# Patient Record
Sex: Female | Born: 1945
Health system: Southern US, Community
[De-identification: ages and names within clinical notes are randomized; demographics above are authoritative.]

## PROBLEM LIST (undated history)

## (undated) DIAGNOSIS — M069 Rheumatoid arthritis, unspecified: Secondary | ICD-10-CM

## (undated) DIAGNOSIS — I10 Essential (primary) hypertension: Secondary | ICD-10-CM

## (undated) DIAGNOSIS — E785 Hyperlipidemia, unspecified: Secondary | ICD-10-CM

## (undated) DIAGNOSIS — M2550 Pain in unspecified joint: Secondary | ICD-10-CM

## (undated) DIAGNOSIS — J45909 Unspecified asthma, uncomplicated: Secondary | ICD-10-CM

## (undated) DIAGNOSIS — I00 Rheumatic fever without heart involvement: Secondary | ICD-10-CM

## (undated) HISTORY — PX: CATARACT EXTRACTION: SUR2

## (undated) HISTORY — DX: Hyperlipidemia, unspecified: E78.5

## (undated) HISTORY — DX: Essential (primary) hypertension: I10

## (undated) HISTORY — DX: Unspecified asthma, uncomplicated: J45.909

## (undated) HISTORY — DX: Rheumatic fever without heart involvement: I00

## (undated) HISTORY — DX: Pain in unspecified joint: M25.50

---

## 2011-12-10 DIAGNOSIS — Z13820 Encounter for screening for osteoporosis: Secondary | ICD-10-CM | POA: Diagnosis not present

## 2011-12-10 DIAGNOSIS — Z1239 Encounter for other screening for malignant neoplasm of breast: Secondary | ICD-10-CM | POA: Diagnosis not present

## 2011-12-10 DIAGNOSIS — Z23 Encounter for immunization: Secondary | ICD-10-CM | POA: Diagnosis not present

## 2011-12-10 DIAGNOSIS — Z0000 Encounter for general adult medical examination without abnormal findings: Secondary | ICD-10-CM | POA: Diagnosis not present

## 2011-12-10 DIAGNOSIS — Z131 Encounter for screening for diabetes mellitus: Secondary | ICD-10-CM | POA: Diagnosis not present

## 2011-12-17 DIAGNOSIS — Z1231 Encounter for screening mammogram for malignant neoplasm of breast: Secondary | ICD-10-CM | POA: Diagnosis not present

## 2012-02-04 DIAGNOSIS — E785 Hyperlipidemia, unspecified: Secondary | ICD-10-CM | POA: Diagnosis not present

## 2012-02-04 DIAGNOSIS — Z23 Encounter for immunization: Secondary | ICD-10-CM | POA: Diagnosis not present

## 2012-02-04 DIAGNOSIS — M2550 Pain in unspecified joint: Secondary | ICD-10-CM | POA: Diagnosis not present

## 2012-02-04 DIAGNOSIS — J45909 Unspecified asthma, uncomplicated: Secondary | ICD-10-CM | POA: Diagnosis not present

## 2012-02-04 DIAGNOSIS — Z79899 Other long term (current) drug therapy: Secondary | ICD-10-CM | POA: Diagnosis not present

## 2012-02-04 DIAGNOSIS — I10 Essential (primary) hypertension: Secondary | ICD-10-CM | POA: Diagnosis not present

## 2012-02-09 DIAGNOSIS — Z79899 Other long term (current) drug therapy: Secondary | ICD-10-CM | POA: Diagnosis not present

## 2012-02-09 DIAGNOSIS — E785 Hyperlipidemia, unspecified: Secondary | ICD-10-CM | POA: Diagnosis not present

## 2012-02-09 DIAGNOSIS — M2550 Pain in unspecified joint: Secondary | ICD-10-CM | POA: Diagnosis not present

## 2012-02-09 DIAGNOSIS — I10 Essential (primary) hypertension: Secondary | ICD-10-CM | POA: Diagnosis not present

## 2012-02-14 ENCOUNTER — Encounter: Payer: Self-pay | Admitting: Pulmonary Disease

## 2012-03-07 DIAGNOSIS — R82998 Other abnormal findings in urine: Secondary | ICD-10-CM | POA: Diagnosis not present

## 2012-03-13 ENCOUNTER — Encounter: Payer: Self-pay | Admitting: Pulmonary Disease

## 2012-03-13 ENCOUNTER — Ambulatory Visit (INDEPENDENT_AMBULATORY_CARE_PROVIDER_SITE_OTHER): Payer: Medicare Other | Admitting: Pulmonary Disease

## 2012-03-13 ENCOUNTER — Ambulatory Visit (INDEPENDENT_AMBULATORY_CARE_PROVIDER_SITE_OTHER)
Admission: RE | Admit: 2012-03-13 | Discharge: 2012-03-13 | Disposition: A | Discharge status: Home / Self Care | Payer: Medicare Other | Source: Ambulatory Visit | Attending: Pulmonary Disease | Admitting: Pulmonary Disease

## 2012-03-13 VITALS — BP 126/60 | HR 66 | Temp 97.7°F | Ht 66.0 in | Wt 163.6 lb

## 2012-03-13 DIAGNOSIS — J8410 Pulmonary fibrosis, unspecified: Secondary | ICD-10-CM | POA: Diagnosis not present

## 2012-03-13 DIAGNOSIS — J449 Chronic obstructive pulmonary disease, unspecified: Secondary | ICD-10-CM | POA: Insufficient documentation

## 2012-03-13 DIAGNOSIS — J45909 Unspecified asthma, uncomplicated: Secondary | ICD-10-CM | POA: Diagnosis not present

## 2012-03-13 IMAGING — CR DG CHEST 2V
2 series · 2 of 2 positions shown · non-contrast
Comparison: None.

CLINICAL DATA: 66-year-old female with asthma.  Hypertension.

CHEST - 2 VIEW

[view not recorded (1 of 2)]
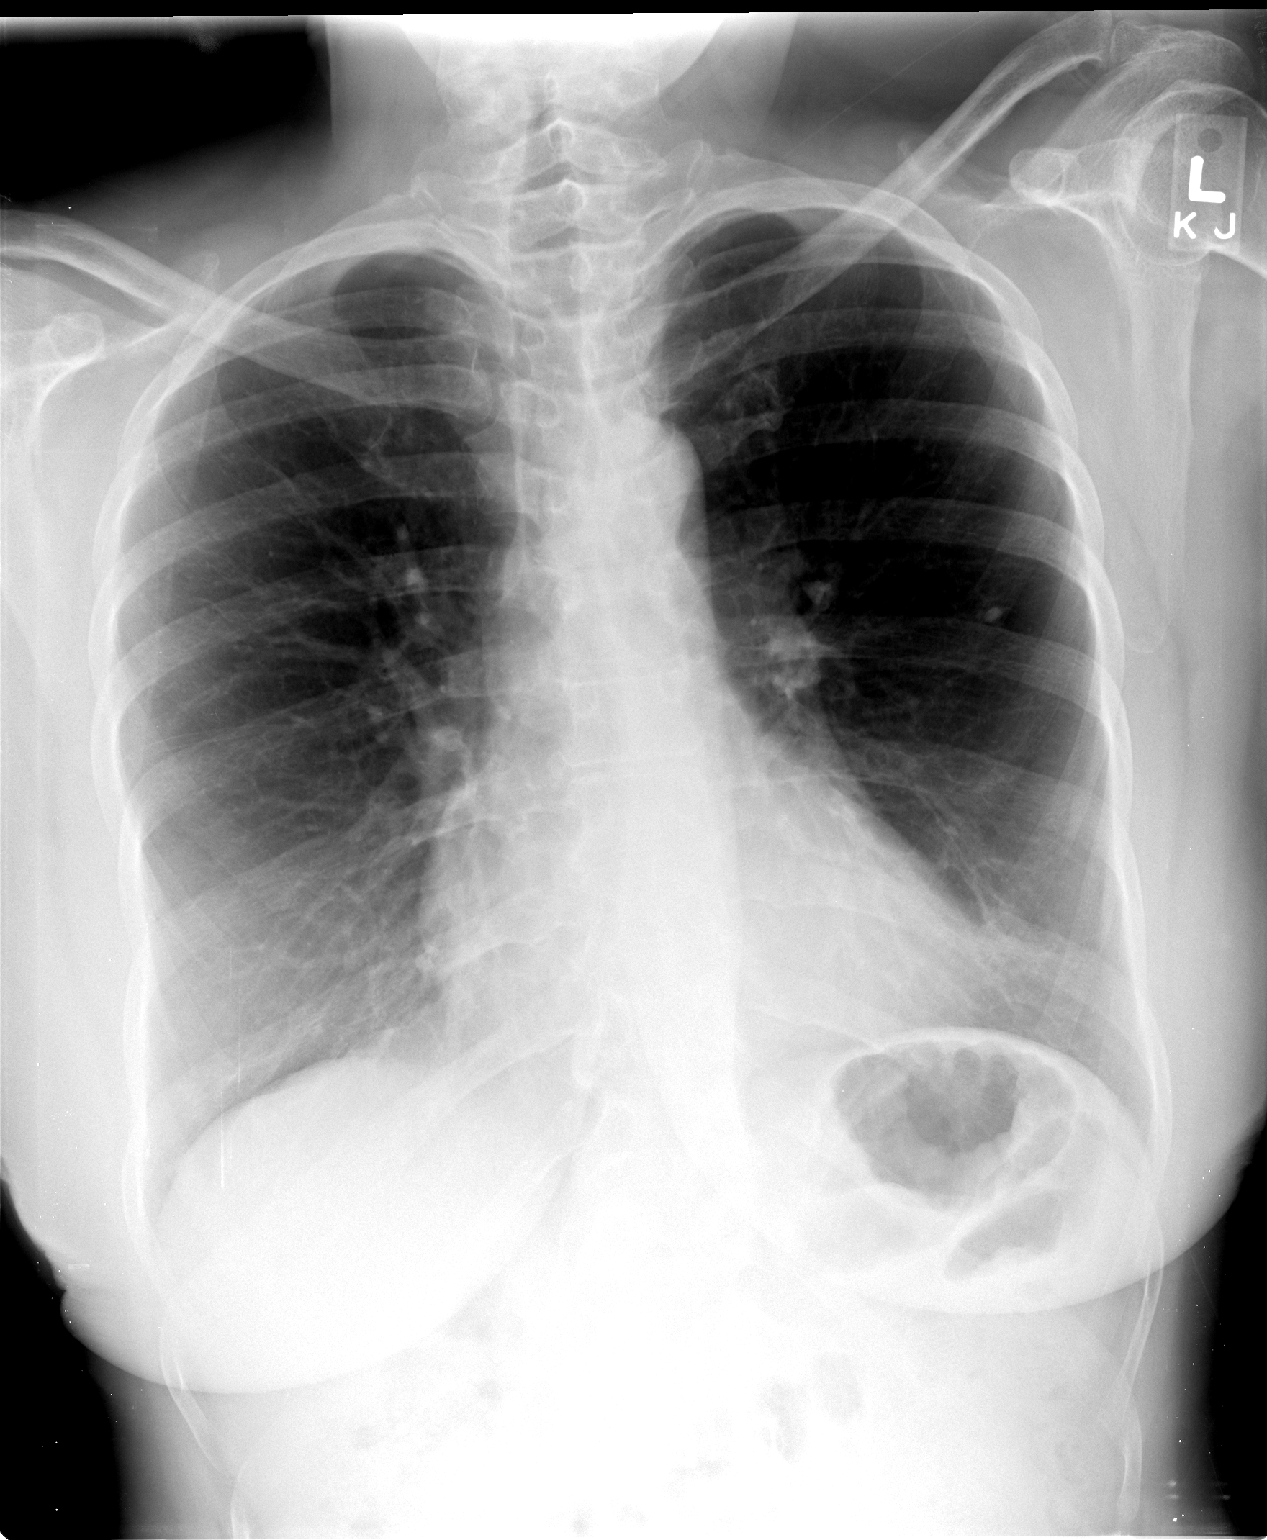

[view not recorded (2 of 2)]
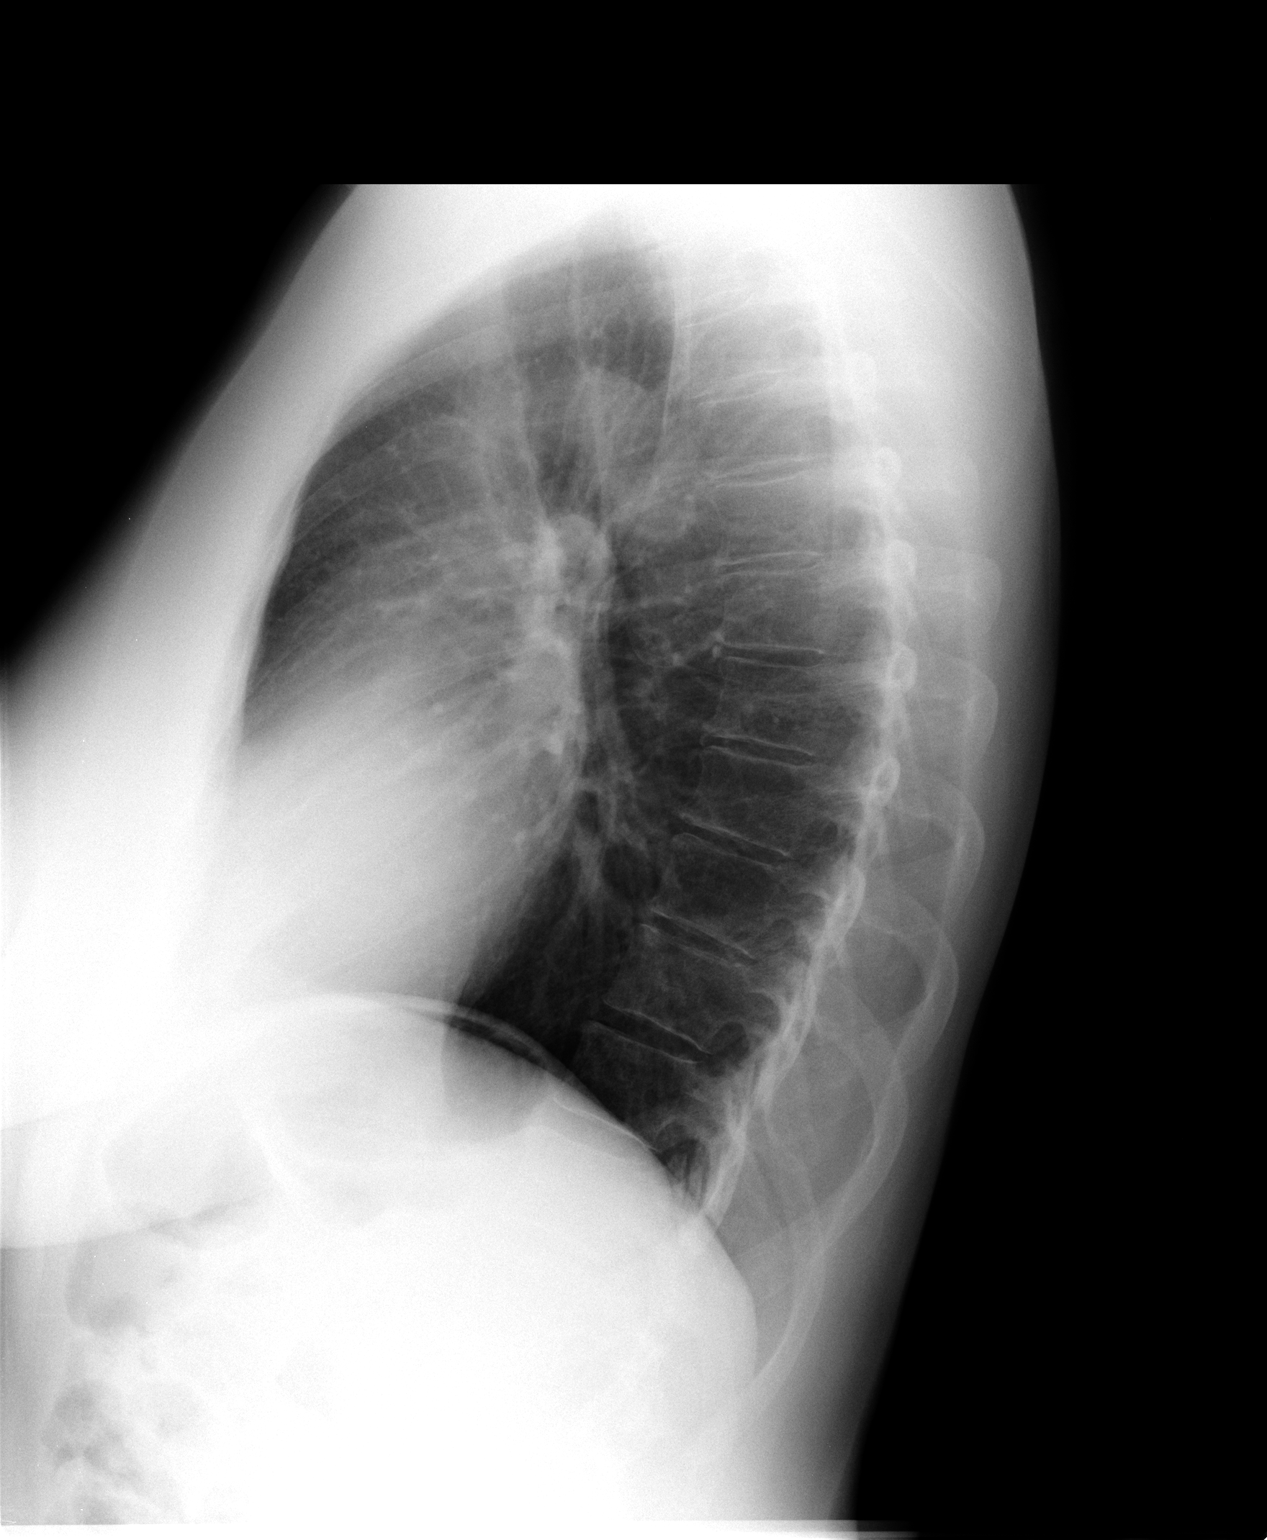

[2 of 2 positions shown; findings below may reference images not displayed]

FINDINGS: Lung volumes are within normal limits.  Cardiac size and
mediastinal contours are within normal limits.  Visualized tracheal
air column is within normal limits.  No pneumothorax or pulmonary
edema.  No pleural effusion or confluent airspace opacity.  Mild
increased linear markings at the lung bases.  Oval probable
calcified granuloma in the left mid lung. There may be calcified
nodes at the left hilum as well.  No acute osseous abnormality
identified.
IMPRESSION: Basilar predominant increased linear markings probably reflects
scarring.  Left lung granuloma.  No acute cardiopulmonary
abnormality.

## 2012-03-13 MED ORDER — VALSARTAN 160 MG PO TABS: 80.0000 mg | ORAL_TABLET | Freq: Every day | ORAL | Status: DC

## 2012-03-13 MED ORDER — MOMETASONE FURO-FORMOTEROL FUM 100-5 MCG/ACT IN AERO: 2.0000 | INHALATION_SPRAY | Freq: Two times a day (BID) | RESPIRATORY_TRACT | Status: DC

## 2012-03-13 NOTE — Patient Instructions (Addendum)
Stop prednisone.  Do not take, and call me if you feel your breathing is significantly worse. Stop lisinopril.  Will give you a prescription for diovan 160mg , one half tablet each day. Stop advair.  Will start dulera 100/5  2 inhalations each am AND pm everyday.  Rinse mouth well.  Try and use your albuterol as little as possible. Ok to stay on singulair for now. Will check a cxr today, and call you with results. Will schedule for breathing tests in 3 weeks, and would like to see you the same day to review.

## 2012-03-13 NOTE — Progress Notes (Signed)
  Subjective:    Patient ID: Heather Austin, female    DOB: [birth year], 66 y.o.   MRN: [id number]  HPI The patient is a 66 year old female who I have been asked to see for management of asthma.  The patient states she was diagnosed with asthma as a child, had significant improvement in her symptoms during her 30s, and started having difficulties with her symptoms again in the 44s.  She has been on Advair for about 10 years, and doesn't feel that it helps her.  She has been allergy tested in the past, but did not think that she had significant allergic disease.  However, she was apparently in a Xolair study from 2010-2012.  She was taken out of the study when she left [place] and moved to [place].  The patient thinks that Xolair helped her.  The patient states that she takes prednisone chronically on a p.r.n. Basis at 10 mg a day, and also uses her rescue inhaler and albuterol nebulizer machine daily.  She states that she currently has audible wheezing, and describes a dyspnea on exertion at less than 2 blocks.  She denies any chronic cough or mucus production, any sinus issues or infections, nor does she have any acid reflux symptoms currently.  She does not think that she has allergy issues in the spring or fall.  She has not had PFTs in greater than 10 years, and has had no recent chest x-ray.  It should also be noted that she has been on an ACE inhibitor for the last 4-5 years.   Review of Systems  Constitutional: Negative for fever and unexpected weight change.  HENT: Negative for ear pain, nosebleeds, congestion, sore throat, rhinorrhea, sneezing, trouble swallowing, dental problem, postnasal drip and sinus pressure.   Eyes: Negative for redness and itching.  Respiratory: Positive for shortness of breath and wheezing. Negative for cough and chest tightness.   Cardiovascular: Negative for palpitations and leg swelling.  Gastrointestinal: Negative for nausea and vomiting.  Genitourinary:  Negative for dysuria.  Musculoskeletal: Negative for joint swelling.  Skin: Negative for rash.  Neurological: Negative for headaches.  Hematological: Does not bruise/bleed easily.  Psychiatric/Behavioral: Negative for dysphoric mood. The patient is not nervous/anxious.        Objective:   Physical Exam Constitutional:  Overweight female, no acute distress  HENT:  Nares patent without discharge, septal deviation to left.  Mild mucosal edema  Oropharynx without exudate, palate and uvula are normal  Eyes:  Perrla, eomi, no scleral icterus  Neck:  No JVD, no TMG  Cardiovascular:  Normal rate, regular rhythm, no rubs or gallops.  No murmurs        Intact distal pulses  Pulmonary :  Normal breath sounds, no stridor or respiratory distress   No rales, rhonchi, or wheezing  Abdominal:  Soft, nondistended, bowel sounds present.  No tenderness noted.   Musculoskeletal:  No lower extremity edema noted.  Lymph Nodes:  No cervical lymphadenopathy noted  Skin:  No cyanosis noted  Neurologic:  Alert, appropriate, moves all 4 extremities without obvious deficit.         Assessment & Plan:

## 2012-03-13 NOTE — Assessment & Plan Note (Signed)
The patient has a history of asthma, and is having symptoms of audible wheezing that sounds more upper airway in origin as well as dyspnea on exertion.  I would find it very unlikely that she is having asthma to the extent that she requires frequent doses of prednisone and daily rescue inhaler use, despite being on Advair and Singulair compliantly.  This would usually tell me that her symptoms are either not being caused by asthma, for that we are missing exacerbating factors.  I am concerned about her ACE inhibitor causing upper airway symptoms that often can mimic asthma, and we also need to consider whether her allergic disease or reflux disease or compounding factors.  I would like to change her Advair to dulera because of its frequent irritation to the upper airway, and we'll also change her ACE inhibitor to Diovan for the next 4 weeks to see if this will help as well.  She is going to need a chest x-ray, pulmonary function tests, and may ultimately need allergy testing.  She does not think that she has significant allergies, however I would find it very unlikely that she could get into a Xolair trial without having significant allergic disease.

## 2012-04-04 ENCOUNTER — Ambulatory Visit (INDEPENDENT_AMBULATORY_CARE_PROVIDER_SITE_OTHER): Payer: Medicare Other | Admitting: Pulmonary Disease

## 2012-04-04 ENCOUNTER — Encounter: Payer: Self-pay | Admitting: Pulmonary Disease

## 2012-04-04 ENCOUNTER — Other Ambulatory Visit: Payer: Medicare Other

## 2012-04-04 VITALS — BP 142/88 | HR 72 | Temp 97.9°F | Ht 67.0 in | Wt 164.0 lb

## 2012-04-04 DIAGNOSIS — J45909 Unspecified asthma, uncomplicated: Secondary | ICD-10-CM

## 2012-04-04 LAB — PULMONARY FUNCTION TEST

## 2012-04-04 MED ORDER — MOMETASONE FURO-FORMOTEROL FUM 100-5 MCG/ACT IN AERO: 2.0000 | INHALATION_SPRAY | Freq: Two times a day (BID) | RESPIRATORY_TRACT | Status: DC

## 2012-04-04 MED ORDER — ALBUTEROL SULFATE HFA 108 (90 BASE) MCG/ACT IN AERS: 2.0000 | INHALATION_SPRAY | Freq: Four times a day (QID) | RESPIRATORY_TRACT | Status: DC | PRN

## 2012-04-04 NOTE — Progress Notes (Signed)
  Subjective:    Patient ID: Heather Austin, female    DOB: 08-27-1945, 66 y.o.   MRN: [id number]  HPI Patient comes in today for followup of her known asthma.  At the last visit, we discontinued her lisinopril and substitute Diovan.  We also changed her Advair to dulera, because of concern over upper airway irritation and pseudo-wheezing.  The patient comes in today where she feels these changes have made significant improvement in her breathing.  She is no longer wheezing or requiring her rescue inhaler.  She did do pulmonary function studies today, and this showed fixed moderate to severe airflow obstruction.  I have reviewed the study with her in detail, and answered all of her questions.   Review of Systems  Constitutional: Negative for fever and unexpected weight change.  HENT: Negative for ear pain, nosebleeds, congestion, sore throat, rhinorrhea, sneezing, trouble swallowing, dental problem, postnasal drip and sinus pressure.   Eyes: Negative for redness and itching.  Respiratory: Positive for shortness of breath ( with exertion). Negative for cough, chest tightness and wheezing.   Cardiovascular: Negative for palpitations and leg swelling.  Gastrointestinal: Negative for nausea and vomiting.  Genitourinary: Negative for dysuria.  Musculoskeletal: Negative for joint swelling.       Joint stiffness  Skin: Negative for rash.  Neurological: Negative for headaches.  Hematological: Does not bruise/bleed easily.  Psychiatric/Behavioral: Negative for dysphoric mood. The patient is not nervous/anxious.        Objective:   Physical Exam Well-developed female in no acute distress Nose without purulence or discharge noted Neck without lymphadenopathy or thyromegaly Chest with totally clear breath sounds, no wheezing Cardiac exam is regular rate and rhythm Lower extremities without edema, cyanosis Alert and oriented, moves all 4 extremities       Assessment & Plan:

## 2012-04-04 NOTE — Patient Instructions (Addendum)
Stay on dulera, and we will give you a prescription for this. Stay off lisinopril, but check with Dr. Jillyn Hidden and make sure diovan is an adequate alternative. Will check allergy panel today, and will call you with results.   Stay on singulair until we get your allergy panel back.  If doing well, followup with me again in 4mos.  Please call if you develop breathing issues.

## 2012-04-04 NOTE — Progress Notes (Signed)
PFT done today. 

## 2012-04-04 NOTE — Addendum Note (Signed)
Addended by: Nita Sells on: 04/04/2012 02:50 PM   Modules accepted: Orders

## 2012-04-04 NOTE — Assessment & Plan Note (Signed)
The patient has moderate to severe airflow obstruction by her PFTs, and my suspicion is this represents a fixed asthma/COPD.  She has had long-standing asthma for most of her life, and not only could not afford treatment in the past, but there really wasn't good anti-inflammatory medication available.  This has lead to airway remodeling and fixed airflow obstruction.  She clearly is doing much better with the elimination of the ACE inhibitor and changing from dry powder to [facility].  I have asked her to check with her primary physician to make sure Diovan isn't adequate substitution for her lisinopril.  She also has a history of being on Xolair, and therefore likely has an allergy component to her disease.

## 2012-04-05 LAB — ALLERGY FULL PROFILE
Allergen, D pternoyssinus,d7: 0.13 kU/L — ABNORMAL HIGH
Allergen,Goose feathers, e70: <0.1 kU/L
Alternaria Alternata: 3.89 kU/L — ABNORMAL HIGH
Aspergillus fumigatus, IgG: 0.9 kU/L — ABNORMAL HIGH
Bahia Grass: 4.11 kU/L — ABNORMAL HIGH
Bermuda Grass: 2.79 kU/L — ABNORMAL HIGH
Box Elder IgE: 0.25 kU/L — ABNORMAL HIGH
Candida Albicans: 0.71 kU/L — ABNORMAL HIGH
Cat Dander: <0.1 kU/L
Common Ragweed: 0.48 kU/L — ABNORMAL HIGH
Curvularia lunata: 1.06 kU/L — ABNORMAL HIGH
D. farinae: 0.17 kU/L — ABNORMAL HIGH
Dog Dander: 1.64 kU/L — ABNORMAL HIGH
Elm IgE: 0.48 kU/L — ABNORMAL HIGH
Fescue: 5.56 kU/L — ABNORMAL HIGH
G005 Rye, Perennial: 4.89 kU/L — ABNORMAL HIGH
G009 Red Top: 5.57 kU/L — ABNORMAL HIGH
Goldenrod: 0.86 kU/L — ABNORMAL HIGH
Helminthosporium halodes: 1.94 kU/L — ABNORMAL HIGH
House Dust Hollister: 0.59 kU/L — ABNORMAL HIGH
IgE (Immunoglobulin E), Serum: 439.4 IU/mL — ABNORMAL HIGH (ref 0.0–180.0)
Lamb's Quarters: 0.26 kU/L — ABNORMAL HIGH
Oak: 0.21 kU/L — ABNORMAL HIGH
Plantain: 0.69 kU/L — ABNORMAL HIGH
Stemphylium Botryosum: 1.74 kU/L — ABNORMAL HIGH
Sycamore Tree: 1.47 kU/L — ABNORMAL HIGH
Timothy Grass: 3.81 kU/L — ABNORMAL HIGH

## 2012-05-15 DIAGNOSIS — M2550 Pain in unspecified joint: Secondary | ICD-10-CM | POA: Diagnosis not present

## 2012-05-15 DIAGNOSIS — E785 Hyperlipidemia, unspecified: Secondary | ICD-10-CM | POA: Diagnosis not present

## 2012-05-15 DIAGNOSIS — R7301 Impaired fasting glucose: Secondary | ICD-10-CM | POA: Diagnosis not present

## 2012-05-15 DIAGNOSIS — E559 Vitamin D deficiency, unspecified: Secondary | ICD-10-CM | POA: Diagnosis not present

## 2012-05-15 DIAGNOSIS — J45909 Unspecified asthma, uncomplicated: Secondary | ICD-10-CM | POA: Diagnosis not present

## 2012-05-15 DIAGNOSIS — Z79899 Other long term (current) drug therapy: Secondary | ICD-10-CM | POA: Diagnosis not present

## 2012-05-15 DIAGNOSIS — I10 Essential (primary) hypertension: Secondary | ICD-10-CM | POA: Diagnosis not present

## 2012-05-16 ENCOUNTER — Encounter: Payer: Self-pay | Admitting: Internal Medicine

## 2012-05-16 ENCOUNTER — Ambulatory Visit (INDEPENDENT_AMBULATORY_CARE_PROVIDER_SITE_OTHER): Payer: Medicare Other | Admitting: Internal Medicine

## 2012-05-16 VITALS — BP 136/80 | HR 68 | Ht 66.0 in | Wt 166.2 lb

## 2012-05-16 DIAGNOSIS — J45909 Unspecified asthma, uncomplicated: Secondary | ICD-10-CM | POA: Diagnosis not present

## 2012-05-16 DIAGNOSIS — J45998 Other asthma: Secondary | ICD-10-CM

## 2012-05-16 NOTE — Patient Instructions (Addendum)
I would recommend that you follow for asthma management with Dr Shelle Iron for now. As long as the asthma is easily controlled with your current meds, that is good enough. If your asthma gets harder to manage, or you begin having a lot of sneezing, itching and sinus trouble, or other allergy discomforts, I will be happy to see you again.

## 2012-05-16 NOTE — Progress Notes (Signed)
05/16/12- 8 yoF never smoker referred for allergy evaluation courtesy of Dr Shelle Iron who follows her for pulmonary management of asthma. Onset of asthma round age 67 or 2. Never had allergic rhinitis. Treated with Xolair for asthma 2 years ago in [place] but did not like shots. We skin test positive then but never on allergy vaccine. No history of allergic problems with foods, skin rashes or sinus disease. She lives in an older house with cross base, dog, hardwood floors, central air, gas heat, no mold and no smokers. She is retired with her work exposure now. Family history a sister has asthma Allergy Profile 04/04/2012-total IgE 439.4 with significant elevations for almost everything on the panel except epithelial allergens/cat, dog and feathers  Prior to Admission medications   Medication Sig Start Date End Date Taking? Authorizing Provider  albuterol (PROVENTIL HFA;VENTOLIN HFA) 108 (90 BASE) MCG/ACT inhaler Inhale 2 puffs into the lungs every 6 (six) hours as needed. 04/04/12  Yes Barbaraann Share, MD  montelukast (SINGULAIR) 10 MG tablet Take 10 mg by mouth at bedtime.   Yes Historical Provider, MD  NIFEdipine (ADALAT CC) 90 MG 24 hr tablet Take 90 mg by mouth daily.   Yes Historical Provider, MD  pravastatin (PRAVACHOL) 20 MG tablet Take 20 mg by mouth daily.   Yes Historical Provider, MD  traMADol (ULTRAM) 50 MG tablet Take 1 tablet by mouth Every 6 hours as needed. 05/15/12  Yes Historical Provider, MD  valsartan (DIOVAN) 160 MG tablet Take 0.5 tablets (80 mg total) by mouth daily. 03/13/12  Yes Barbaraann Share, MD  mometasone-formoterol (DULERA) 100-5 MCG/ACT AERO Inhale 2 puffs into the lungs 2 (two) times daily. 04/04/12   Barbaraann Share, MD   Past Medical History  Diagnosis Date  . Asthma   . Multiple joint pain   . HTN (hypertension), benign   . Hyperlipidemia    History reviewed. No pertinent past surgical history. Family History  Problem Relation Age of Onset  . Asthma  Sister   . Allergies Son   . Allergies Daughter    History   Social History  . Marital Status: Married    Spouse Name: N/A    Number of Children: N/A  . Years of Education: N/A   Occupational History  . Not on file.   Social History Main Topics  . Smoking status: Never Smoker   . Smokeless tobacco: Never Used  . Alcohol Use: No  . Drug Use: No  . Sexually Active: Not on file   Other Topics Concern  . Not on file   Social History Narrative  . No narrative on file   ROS-see HPI Constitutional:   No-   weight loss, night sweats, fevers, chills, fatigue, lassitude. HEENT:   No-  headaches, difficulty swallowing, tooth/dental problems, sore throat,       No-  sneezing, itching, ear ache, nasal congestion, post nasal drip,  CV:  No-   chest pain, orthopnea, PND, swelling in lower extremities, anasarca,                                  dizziness, palpitations Resp: +  shortness of breath with exertion or at rest.              No-   productive cough,  + non-productive cough,  No- coughing up of blood.  No-   change in color of mucus.  + wheezing.   Skin: No-   rash or lesions. GI:  No-   heartburn, indigestion, abdominal pain, nausea, vomiting, diarrhea,                 change in bowel habits, loss of appetite GU: No-   dysuria, change in color of urine, no urgency or frequency.  No- flank pain. MS:  No-   joint pain or swelling.  No- decreased range of motion.  No- back pain. Neuro-     nothing unusual Psych:  No- change in mood or affect. No depression or anxiety.  No memory loss.  OBJ- Physical Exam General- Alert, Oriented, Affect-appropriate, Distress- none acute Skin- rash-none, lesions- none, excoriation- none Lymphadenopathy- none Head- atraumatic            Eyes- Gross vision intact, PERRLA, conjunctivae and secretions clear            Ears- Hearing, canals-normal            Nose- Clear, no-Septal dev, mucus, polyps, erosion, perforation              Throat- Mallampati II , mucosa clear , drainage- none, tonsils- atrophic Neck- flexible , trachea midline, no stridor , thyroid nl, carotid no bruit Chest - symmetrical excursion , unlabored           Heart/CV- RRR , no murmur , no gallop  , no rub, nl s1 s2                           - JVD- none , edema- none, stasis changes- none, varices- none           Lung- clear to P&A, wheeze- none, cough- none , dullness-none, rub- none           Chest wall-  Abd- tender-no, distended-no, bowel sounds-present, HSM- no Br/ Gen/ Rectal- Not done, not indicated Extrem- cyanosis- none, clubbing, none, atrophy- none, strength- nl Neuro- grossly intact to observation

## 2012-05-27 ENCOUNTER — Encounter: Payer: Self-pay | Admitting: Internal Medicine

## 2012-05-27 NOTE — Assessment & Plan Note (Addendum)
She says that she is comfortable now and satisfied with her control, not seeing a need to do anything more. We discussed her allergy profile and its implication that environmental allergy may be important at times. We discussed environmental precautions including dust mite control We can consider allergy skin testing and other lines of intervention in the future if needed.

## 2012-05-31 DIAGNOSIS — M25519 Pain in unspecified shoulder: Secondary | ICD-10-CM | POA: Diagnosis not present

## 2012-05-31 DIAGNOSIS — M25549 Pain in joints of unspecified hand: Secondary | ICD-10-CM | POA: Diagnosis not present

## 2012-07-21 DIAGNOSIS — M2550 Pain in unspecified joint: Secondary | ICD-10-CM | POA: Diagnosis not present

## 2012-07-21 DIAGNOSIS — J45909 Unspecified asthma, uncomplicated: Secondary | ICD-10-CM | POA: Diagnosis not present

## 2012-08-03 ENCOUNTER — Ambulatory Visit: Payer: Medicare Other | Admitting: Pulmonary Disease

## 2012-08-09 DIAGNOSIS — M2550 Pain in unspecified joint: Secondary | ICD-10-CM | POA: Diagnosis not present

## 2012-08-09 DIAGNOSIS — M79609 Pain in unspecified limb: Secondary | ICD-10-CM | POA: Diagnosis not present

## 2012-08-16 DIAGNOSIS — M069 Rheumatoid arthritis, unspecified: Secondary | ICD-10-CM | POA: Diagnosis not present

## 2012-08-30 ENCOUNTER — Ambulatory Visit (INDEPENDENT_AMBULATORY_CARE_PROVIDER_SITE_OTHER): Payer: Medicare Other | Admitting: Pulmonary Disease

## 2012-08-30 ENCOUNTER — Encounter: Payer: Self-pay | Admitting: Pulmonary Disease

## 2012-08-30 VITALS — BP 190/80 | HR 81 | Temp 97.3°F | Ht 67.0 in | Wt 164.4 lb

## 2012-08-30 DIAGNOSIS — J45998 Other asthma: Secondary | ICD-10-CM

## 2012-08-30 DIAGNOSIS — J45909 Unspecified asthma, uncomplicated: Secondary | ICD-10-CM

## 2012-08-30 MED ORDER — MOMETASONE FURO-FORMOTEROL FUM 100-5 MCG/ACT IN AERO: 2.0000 | INHALATION_SPRAY | Freq: Two times a day (BID) | RESPIRATORY_TRACT | Status: DC

## 2012-08-30 MED ORDER — MONTELUKAST SODIUM 10 MG PO TABS: 10.0000 mg | ORAL_TABLET | Freq: Every day | ORAL | Status: DC

## 2012-08-30 MED ORDER — ALBUTEROL SULFATE HFA 108 (90 BASE) MCG/ACT IN AERS: 2.0000 | INHALATION_SPRAY | Freq: Four times a day (QID) | RESPIRATORY_TRACT | Status: DC | PRN

## 2012-08-30 NOTE — Addendum Note (Signed)
Addended by: Nita Sells on: 08/30/2012 11:56 AM   Modules accepted: Orders

## 2012-08-30 NOTE — Assessment & Plan Note (Signed)
The patient has significant asthma with allergy component, but overall is doing fairly well on her current regimen.  I would like her to take an over-the-counter antihistamine at least thru spring allergy season, and most likely will need in the fall as well.  I stressed to her the importance of inhaled corticosteroids again, and asked her to stay on the dulera religiously.  She will followup with me in 6 months if doing well, but is to call if she is having increased symptoms.

## 2012-08-30 NOTE — Patient Instructions (Addendum)
Will refill your medications Stay on dulera twice a day everyday. Continue singulair for your allergies, but I want you to take zyrtec 10mg  otc everynight at bedtime until the end of June.  May have to start it back during fall allergy season in Sept/Oct.   followup with me in 6mos

## 2012-08-30 NOTE — Progress Notes (Signed)
  Subjective:    Patient ID: Heather Austin, female    DOB: 08-13-1945, 68 y.o.   MRN: [id number]  HPI Patient comes in today for followup of her asthma with significant allergic component.  She has done well overall since the last visit, but with the current allergy season has had increased symptoms when going outside that requires her to use her rescue inhaler.  She is staying on Singulair, but has not been taking an antihistamine.  Overall she is satisfied with her asthma control, and has not had any significant flareup.   Review of Systems  Constitutional: Negative for fever and unexpected weight change.  HENT: Negative for ear pain, nosebleeds, congestion, sore throat, rhinorrhea, sneezing, trouble swallowing, dental problem, postnasal drip and sinus pressure.   Eyes: Negative for redness and itching.  Respiratory: Positive for wheezing. Negative for cough, chest tightness and shortness of breath.   Cardiovascular: Negative for palpitations and leg swelling.  Gastrointestinal: Negative for nausea and vomiting.  Genitourinary: Negative for dysuria.  Musculoskeletal: Negative for joint swelling.  Skin: Negative for rash.  Neurological: Negative for headaches.  Hematological: Does not bruise/bleed easily.  Psychiatric/Behavioral: Negative for dysphoric mood. The patient is not nervous/anxious.        Objective:   Physical Exam Well-developed female in no acute distress Nose without purulence or discharge noted Neck without lymphadenopathy or thyromegaly Chest with clear breath sounds bilaterally, no wheezing Cardiac exam with regular rate and rhythm Lower extremities without edema, cyanosis Alert and oriented, moves all 4 extremities.       Assessment & Plan:

## 2012-09-27 DIAGNOSIS — M069 Rheumatoid arthritis, unspecified: Secondary | ICD-10-CM | POA: Diagnosis not present

## 2012-12-14 DIAGNOSIS — M069 Rheumatoid arthritis, unspecified: Secondary | ICD-10-CM | POA: Diagnosis not present

## 2013-02-13 DIAGNOSIS — M069 Rheumatoid arthritis, unspecified: Secondary | ICD-10-CM | POA: Diagnosis not present

## 2013-03-02 ENCOUNTER — Encounter: Payer: Self-pay | Admitting: Pulmonary Disease

## 2013-03-02 ENCOUNTER — Ambulatory Visit (INDEPENDENT_AMBULATORY_CARE_PROVIDER_SITE_OTHER): Payer: Medicare Other | Admitting: Pulmonary Disease

## 2013-03-02 VITALS — BP 128/88 | HR 84 | Temp 97.3°F | Ht 66.5 in | Wt 161.6 lb

## 2013-03-02 DIAGNOSIS — Z23 Encounter for immunization: Secondary | ICD-10-CM

## 2013-03-02 DIAGNOSIS — J45909 Unspecified asthma, uncomplicated: Secondary | ICD-10-CM

## 2013-03-02 DIAGNOSIS — R0609 Other forms of dyspnea: Secondary | ICD-10-CM | POA: Insufficient documentation

## 2013-03-02 DIAGNOSIS — R0989 Other specified symptoms and signs involving the circulatory and respiratory systems: Secondary | ICD-10-CM | POA: Diagnosis not present

## 2013-03-02 DIAGNOSIS — R0600 Dyspnea, unspecified: Secondary | ICD-10-CM

## 2013-03-02 MED ORDER — MOMETASONE FURO-FORMOTEROL FUM 200-5 MCG/ACT IN AERO: 2.0000 | INHALATION_SPRAY | Freq: Two times a day (BID) | RESPIRATORY_TRACT | Status: DC

## 2013-03-02 MED ORDER — ALBUTEROL SULFATE HFA 108 (90 BASE) MCG/ACT IN AERS: 2.0000 | INHALATION_SPRAY | Freq: Four times a day (QID) | RESPIRATORY_TRACT | Status: DC | PRN

## 2013-03-02 NOTE — Patient Instructions (Signed)
Will increase dulera to the 200/5 strength.  Take 2 puffs am and pm everyday, and rinse mouth. Continue with singulair for now. Will send in prescription for your albuterol. Will schedule for breathing studies, and also a scan of your chest to evaluate for scarring from rheumatoid disease.  Will call you with results. Continue to stay active. Will arrange followup once I see test results.

## 2013-03-02 NOTE — Assessment & Plan Note (Signed)
The patient is having worsening dyspnea on exertion despite being on adequate asthma medication. This may simply be poor asthma control because of her allergic disease, however it has come to my attention that she may have rheumatoid arthritis and does have to consider the possibility of rheumatoid lung. She did have "scarring" on her last chest x-ray, but it was not overly impressive. I will get notes from her rheumatologist, and also schedule her for a CT chest and PFTs.

## 2013-03-02 NOTE — Progress Notes (Signed)
  Subjective:    Patient ID: Heather Austin, female    DOB: 1945-04-30, 67 y.o.   MRN: [id number]  HPI The patient comes in today for followup of her known asthma with significant allergic component.  She has been evaluated by allergy, but was doing so well, and decided against further testing or allergy vaccine.  She now has seen worsening shortness of breath over the last 6 months, and does tell me that she had a change in her medication for arthritis.  I have asked her specifically about the type of arthritis, and she thinks that it is rheumatoid.  She has not had a significant cough or mucus, but it has clearly had decrease in exertional tolerance.  Her chest x-ray last year showed some mild scarring at the bases.   Review of Systems  Constitutional: Negative for fever and unexpected weight change.  HENT: Negative for congestion, dental problem, ear pain, nosebleeds, postnasal drip, rhinorrhea, sinus pressure, sneezing, sore throat and trouble swallowing.   Eyes: Negative for redness and itching.  Respiratory: Positive for shortness of breath and wheezing. Negative for cough and chest tightness.   Cardiovascular: Negative for palpitations and leg swelling.  Gastrointestinal: Negative for nausea and vomiting.  Genitourinary: Negative for dysuria.  Musculoskeletal: Negative for joint swelling.  Skin: Negative for rash.  Neurological: Negative for headaches.  Hematological: Does not bruise/bleed easily.  Psychiatric/Behavioral: Negative for dysphoric mood. The patient is not nervous/anxious.        Objective:   Physical Exam Well-developed female in no acute distress Nose without purulence or d/c noted. Neck without LN or TMG Chest with fairly clear bs, no wheezing or rhonchi Cor with rrr LE with no significant edema, no cyanosis Alert and oriented, moves all 4.          Assessment & Plan:

## 2013-03-02 NOTE — Assessment & Plan Note (Signed)
The patient feels that her breathing has worsened over the last 6 months, and she has been using increased rescue inhaler. She has a significant allergic component to her asthma, and Singulair has been started as well. She had decided to hold off on allergy vaccine or further evaluation, because she has been stable in the past. It is unclear whether her worsening shortness of breath is due to her asthma, or is there another process ongoing.

## 2013-03-05 ENCOUNTER — Ambulatory Visit (INDEPENDENT_AMBULATORY_CARE_PROVIDER_SITE_OTHER)
Admission: RE | Admit: 2013-03-05 | Discharge: 2013-03-05 | Disposition: A | Discharge status: Home / Self Care | Payer: Medicare Other | Source: Ambulatory Visit | Attending: Pulmonary Disease | Admitting: Pulmonary Disease

## 2013-03-05 DIAGNOSIS — R0600 Dyspnea, unspecified: Secondary | ICD-10-CM

## 2013-03-05 DIAGNOSIS — J984 Other disorders of lung: Secondary | ICD-10-CM | POA: Diagnosis not present

## 2013-03-05 DIAGNOSIS — R0989 Other specified symptoms and signs involving the circulatory and respiratory systems: Secondary | ICD-10-CM | POA: Diagnosis not present

## 2013-03-05 DIAGNOSIS — R0609 Other forms of dyspnea: Secondary | ICD-10-CM

## 2013-03-05 IMAGING — CT CT CHEST W/O CM
1 of 5 series · 4 of 36 positions shown, 5 images · non-contrast
Comparison: None.

CLINICAL DATA: Increasing shortness of breath with exertion since
starting the medication. History of asthma.

EXAM:
CT CHEST WITHOUT CONTRAST
TECHNIQUE: Multidetector CT imaging of the chest was performed following the
standard protocol without IV contrast.

[Series 602: cor · coronal · 0.65mm/px · 4 of 95 slices shown, 5 images]
[im 19/95  mediastinal]
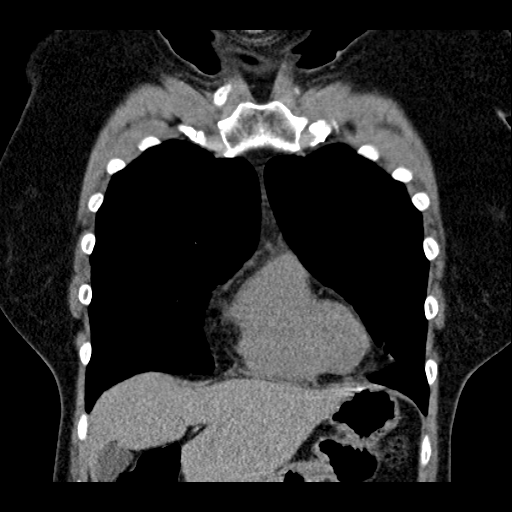
[im 19/95  lung]
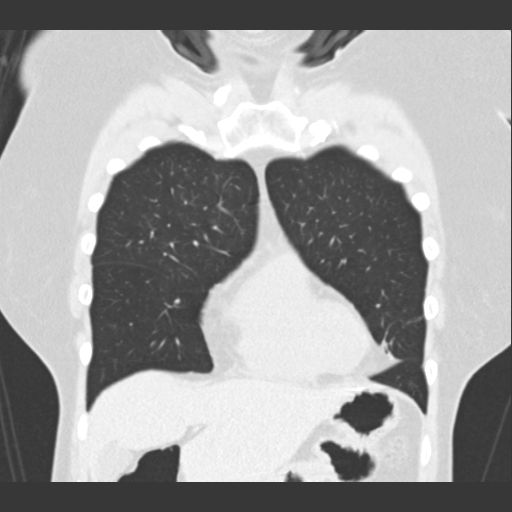
[im 38/95  lung]
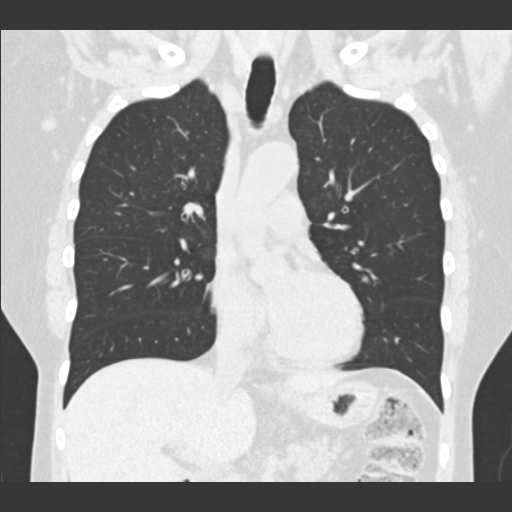
[im 57/95  lung]
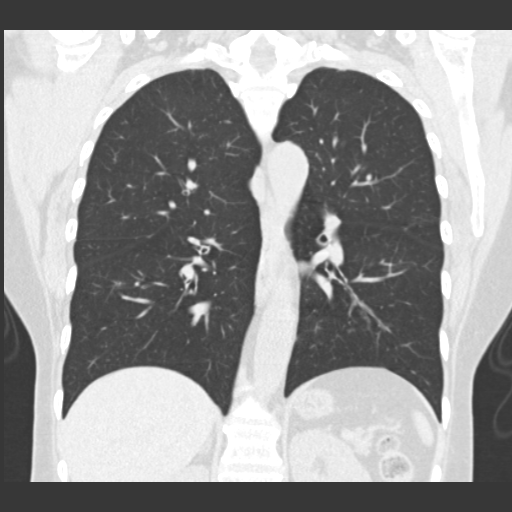
[im 76/95  lung]
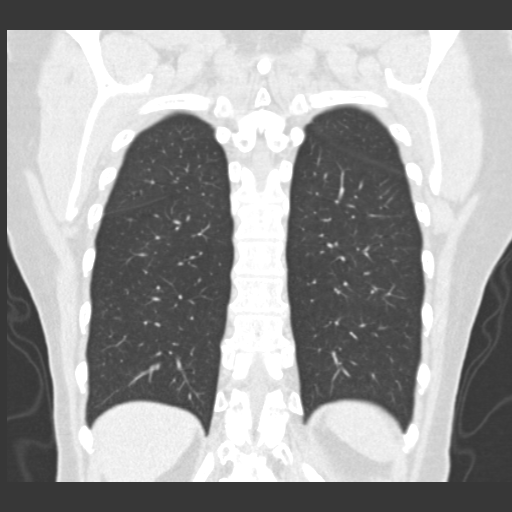

[4 of 36 positions shown; findings below may reference images not displayed]

FINDINGS: No pathologically enlarged mediastinal, hilar or axillary lymph
nodes. Calcified left hilar lymph nodes. Heart size normal. No
pericardial effusion.

Benign appearing calcifications are seen in the lingula. There are a
few scattered tiny pulmonary nodular densities, measuring 4 mm or
less in size (example series 5, image 31, along the minor fissure).
No subpleural reticulation, traction bronchiectasis/
bronchiolectasis, architectural distortion or honeycombing. No air
trapping on inspiratory and expiratory imaging. No pleural fluid.

Incidental imaging of the upper abdomen shows no acute findings. No
worrisome lytic or sclerotic lesions. Scattered degenerative changes
are seen in the spine.
IMPRESSION: 1. No evidence of interstitial lung disease or other findings to
explain the patient's increasing shortness of breath with exertion.
2. Scattered tiny pulmonary nodular densities, likely subpleural
lymph nodes. If the patient is at high risk for bronchogenic
carcinoma, follow-up chest CT at [[facility]] is recommended. If the
patient is at low risk, no follow-up is needed. This recommendation
follows the consensus statement: Guidelines for Management of Small
Pulmonary Nodules Detected on CT Scans: A Statement from the

## 2013-03-20 DIAGNOSIS — Z23 Encounter for immunization: Secondary | ICD-10-CM | POA: Diagnosis not present

## 2013-03-20 DIAGNOSIS — R748 Abnormal levels of other serum enzymes: Secondary | ICD-10-CM | POA: Diagnosis not present

## 2013-03-20 DIAGNOSIS — Z1211 Encounter for screening for malignant neoplasm of colon: Secondary | ICD-10-CM | POA: Diagnosis not present

## 2013-03-20 DIAGNOSIS — I10 Essential (primary) hypertension: Secondary | ICD-10-CM | POA: Diagnosis not present

## 2013-03-26 ENCOUNTER — Ambulatory Visit (INDEPENDENT_AMBULATORY_CARE_PROVIDER_SITE_OTHER): Payer: Medicare Other | Admitting: Pulmonary Disease

## 2013-03-26 DIAGNOSIS — R0609 Other forms of dyspnea: Secondary | ICD-10-CM

## 2013-03-26 DIAGNOSIS — R0989 Other specified symptoms and signs involving the circulatory and respiratory systems: Secondary | ICD-10-CM | POA: Diagnosis not present

## 2013-03-26 DIAGNOSIS — R0600 Dyspnea, unspecified: Secondary | ICD-10-CM

## 2013-03-26 DIAGNOSIS — J45909 Unspecified asthma, uncomplicated: Secondary | ICD-10-CM

## 2013-03-26 LAB — PULMONARY FUNCTION TEST

## 2013-03-26 NOTE — Progress Notes (Signed)
PFT done today. 

## 2013-03-27 ENCOUNTER — Telehealth: Payer: Self-pay | Admitting: Pulmonary Disease

## 2013-03-27 ENCOUNTER — Encounter: Payer: Self-pay | Admitting: Pulmonary Disease

## 2013-03-27 DIAGNOSIS — M069 Rheumatoid arthritis, unspecified: Secondary | ICD-10-CM | POA: Diagnosis not present

## 2013-03-27 NOTE — Telephone Encounter (Signed)
Results have been explained to patient, pt expressed understanding. Nothing further needed.  

## 2013-03-27 NOTE — Telephone Encounter (Signed)
Please let pt know that her breathing studies are completely stable from last year.  Good news.  Will see her on 12/22 to see how things are going on increased dulera dose.

## 2013-04-09 ENCOUNTER — Encounter: Payer: Self-pay | Admitting: Pulmonary Disease

## 2013-04-16 ENCOUNTER — Ambulatory Visit (INDEPENDENT_AMBULATORY_CARE_PROVIDER_SITE_OTHER): Payer: Medicare Other | Admitting: Pulmonary Disease

## 2013-04-16 ENCOUNTER — Encounter: Payer: Self-pay | Admitting: Pulmonary Disease

## 2013-04-16 VITALS — BP 142/98 | HR 67 | Temp 98.1°F | Ht 67.0 in | Wt 156.6 lb

## 2013-04-16 DIAGNOSIS — R0989 Other specified symptoms and signs involving the circulatory and respiratory systems: Secondary | ICD-10-CM | POA: Diagnosis not present

## 2013-04-16 DIAGNOSIS — J45909 Unspecified asthma, uncomplicated: Secondary | ICD-10-CM

## 2013-04-16 DIAGNOSIS — R0609 Other forms of dyspnea: Secondary | ICD-10-CM

## 2013-04-16 DIAGNOSIS — R0600 Dyspnea, unspecified: Secondary | ICD-10-CM

## 2013-04-16 MED ORDER — MOMETASONE FURO-FORMOTEROL FUM 200-5 MCG/ACT IN AERO: 2.0000 | INHALATION_SPRAY | Freq: Two times a day (BID) | RESPIRATORY_TRACT | Status: DC

## 2013-04-16 NOTE — Assessment & Plan Note (Signed)
The patient feels that her breathing is definitely improved on the higher strength of dulera. We will continue her on this medication, but if she has a recurrence of her breathing issues, I suspect she is going to need more aggressive treatment of her allergic disease.

## 2013-04-16 NOTE — Progress Notes (Signed)
   Subjective:    Patient ID: Heather Austin, female    DOB: 1946/03/25, 67 y.o.   MRN: [id number]  HPI Patient comes in today for followup of her known asthma. Her breathing has improved since being on the higher strength of dulera, and she has not required frequent use of her rescue inhaler. She has had a CT scan of her chest in light of her history of rheumatoid arthritis, and there was no evidence for interstitial lung disease. Her pulmonary function studies have been completely stable.   Review of Systems  Constitutional: Negative for fever and unexpected weight change.  HENT: Negative for congestion, dental problem, ear pain, nosebleeds, postnasal drip, rhinorrhea, sinus pressure, sneezing, sore throat and trouble swallowing.   Eyes: Negative for redness and itching.  Respiratory: Positive for cough and chest tightness ( discomfort). Negative for shortness of breath and wheezing.   Cardiovascular: Negative for palpitations and leg swelling.  Gastrointestinal: Negative for nausea and vomiting.  Genitourinary: Negative for dysuria.  Musculoskeletal: Negative for joint swelling.  Skin: Negative for rash.  Neurological: Negative for headaches.  Hematological: Does not bruise/bleed easily.  Psychiatric/Behavioral: Negative for dysphoric mood. The patient is not nervous/anxious.        Objective:   Physical Exam Thin female in no acute distress Nose without purulence or discharge noted Neck without lymphadenopathy or thyromegaly Chest totally clear to auscultation, no wheezing Heart exam with regular rate and rhythm Lower extremities without edema, no cyanosis Alert and oriented, moves all 4 extremities.        Assessment & Plan:

## 2013-04-16 NOTE — Patient Instructions (Signed)
Will call in a prescription for the dulera 200 strength if we have not already done so. Stay as active as possible. followup with me in 6mos if doing well, but call if having breathing issues.

## 2013-05-28 DIAGNOSIS — L659 Nonscarring hair loss, unspecified: Secondary | ICD-10-CM | POA: Diagnosis not present

## 2013-05-28 DIAGNOSIS — M069 Rheumatoid arthritis, unspecified: Secondary | ICD-10-CM | POA: Diagnosis not present

## 2013-06-11 DIAGNOSIS — Z1231 Encounter for screening mammogram for malignant neoplasm of breast: Secondary | ICD-10-CM | POA: Diagnosis not present

## 2013-07-27 ENCOUNTER — Other Ambulatory Visit: Payer: Self-pay | Admitting: Pulmonary Disease

## 2013-07-31 ENCOUNTER — Encounter: Payer: Self-pay | Admitting: Pulmonary Disease

## 2013-07-31 ENCOUNTER — Ambulatory Visit (INDEPENDENT_AMBULATORY_CARE_PROVIDER_SITE_OTHER): Payer: Medicare Other | Admitting: Pulmonary Disease

## 2013-07-31 VITALS — BP 158/100 | HR 76 | Temp 97.9°F | Ht 66.0 in | Wt 157.0 lb

## 2013-07-31 DIAGNOSIS — R0600 Dyspnea, unspecified: Secondary | ICD-10-CM

## 2013-07-31 DIAGNOSIS — R0989 Other specified symptoms and signs involving the circulatory and respiratory systems: Secondary | ICD-10-CM

## 2013-07-31 DIAGNOSIS — J45909 Unspecified asthma, uncomplicated: Secondary | ICD-10-CM

## 2013-07-31 DIAGNOSIS — R0609 Other forms of dyspnea: Secondary | ICD-10-CM

## 2013-07-31 MED ORDER — PREDNISONE 10 MG PO TABS: ORAL_TABLET | ORAL | Status: DC

## 2013-07-31 NOTE — Patient Instructions (Signed)
Will treat with an 8 day course of prednisone to see if things improve.  Please call in 2 weeks to give me an update. No change in breathing medications.  Will send in refill for your albuterol, but do not overuse.  If you are having to use everyday, should call us to discuss. If doing well, followup with me again in 50mos. (can cancel any recent upcoming apptms).

## 2013-07-31 NOTE — Assessment & Plan Note (Signed)
I suspect the patient's dyspnea on exertion is related to something else other than her asthma. We will continue to follow, and consider all options if she does not improve.

## 2013-07-31 NOTE — Assessment & Plan Note (Signed)
The patient has increasing shortness of breath, but her spirometry today only shows a mild decrease from her prior numbers. She has no bronchospasm on exam today, and moves adequate air. She has upper airway noise, but her flow volume loop does not show any significant abnormalities on the inspiratory limb. I will treat her emperically with a short course of prednisone, and I have asked her to continue with her bronchodilator regimen. I've also cautioned her about excessive rescue inhaler use, and to use this as a red flag to contact us with increased symptoms.

## 2013-07-31 NOTE — Progress Notes (Signed)
   Subjective:    Patient ID: Heather Austin, female    DOB: 02-12-1946, 68 y.o.   MRN: [id number]  HPI The patient comes in today for an acute sick visit. She has known moderate asthma, and is on a very good bronchodilator regimen. She noticed increasing shortness of breath starting last month, especially with exertion, and thought this was related to her asthma. She has been overusing her rescue inhaler, but yet she did not feel it made an immediate impact to her breathing at the time of difficulty. She also notes wheezing, but describes classic upper airway pseudo wheezing. She has not had any chest congestion or cough with purulent mucus.   Review of Systems  Constitutional: Negative for fever and unexpected weight change.  HENT: Negative for congestion, dental problem, ear pain, nosebleeds, postnasal drip, rhinorrhea, sinus pressure, sneezing, sore throat and trouble swallowing.   Eyes: Negative for redness and itching.  Respiratory: Positive for cough, shortness of breath and wheezing. Negative for chest tightness.   Cardiovascular: Negative for palpitations and leg swelling.  Gastrointestinal: Negative for nausea and vomiting.  Genitourinary: Negative for dysuria.  Musculoskeletal: Negative for joint swelling.  Skin: Negative for rash.  Neurological: Negative for headaches.  Hematological: Does not bruise/bleed easily.  Psychiatric/Behavioral: Negative for dysphoric mood. The patient is not nervous/anxious.        Objective:   Physical Exam Thin female in no acute distress Nose without purulence or discharge noted Oropharynx clear Neck without lymphadenopathy or thyromegaly, but very prominent squeak and wheezes over the laryngeal area. Chest totally clear to auscultation with no true wheezing Cardiac exam with regular rate and rhythm Lower extremities without edema, no cyanosis Alert and oriented, moves all 4 extremities       Assessment & Plan:

## 2013-08-21 DIAGNOSIS — D485 Neoplasm of uncertain behavior of skin: Secondary | ICD-10-CM | POA: Diagnosis not present

## 2013-08-21 DIAGNOSIS — D2330 Other benign neoplasm of skin of unspecified part of face: Secondary | ICD-10-CM | POA: Diagnosis not present

## 2013-08-31 DIAGNOSIS — M069 Rheumatoid arthritis, unspecified: Secondary | ICD-10-CM | POA: Diagnosis not present

## 2013-08-31 DIAGNOSIS — L659 Nonscarring hair loss, unspecified: Secondary | ICD-10-CM | POA: Diagnosis not present

## 2013-09-02 ENCOUNTER — Other Ambulatory Visit: Payer: Self-pay | Admitting: Pulmonary Disease

## 2013-09-14 DIAGNOSIS — R197 Diarrhea, unspecified: Secondary | ICD-10-CM | POA: Diagnosis not present

## 2013-10-15 ENCOUNTER — Ambulatory Visit: Payer: Medicare Other | Admitting: Pulmonary Disease

## 2013-10-23 DIAGNOSIS — Z86010 Personal history of colonic polyps: Secondary | ICD-10-CM | POA: Diagnosis not present

## 2013-10-23 DIAGNOSIS — K648 Other hemorrhoids: Secondary | ICD-10-CM | POA: Diagnosis not present

## 2013-10-23 DIAGNOSIS — D126 Benign neoplasm of colon, unspecified: Secondary | ICD-10-CM | POA: Diagnosis not present

## 2013-10-23 DIAGNOSIS — K5730 Diverticulosis of large intestine without perforation or abscess without bleeding: Secondary | ICD-10-CM | POA: Diagnosis not present

## 2013-10-23 DIAGNOSIS — Z09 Encounter for follow-up examination after completed treatment for conditions other than malignant neoplasm: Secondary | ICD-10-CM | POA: Diagnosis not present

## 2013-12-20 ENCOUNTER — Other Ambulatory Visit: Payer: Self-pay | Admitting: Pulmonary Disease

## 2014-01-30 ENCOUNTER — Ambulatory Visit (INDEPENDENT_AMBULATORY_CARE_PROVIDER_SITE_OTHER): Payer: Medicare Other | Admitting: Pulmonary Disease

## 2014-01-30 ENCOUNTER — Encounter: Payer: Self-pay | Admitting: Pulmonary Disease

## 2014-01-30 VITALS — BP 122/64 | HR 70 | Temp 98.0°F | Ht 66.0 in | Wt 152.8 lb

## 2014-01-30 DIAGNOSIS — J4520 Mild intermittent asthma, uncomplicated: Secondary | ICD-10-CM | POA: Diagnosis not present

## 2014-01-30 DIAGNOSIS — Z23 Encounter for immunization: Secondary | ICD-10-CM | POA: Diagnosis not present

## 2014-01-30 DIAGNOSIS — IMO0001 Reserved for inherently not codable concepts without codable children: Secondary | ICD-10-CM

## 2014-01-30 MED ORDER — ALBUTEROL SULFATE HFA 108 (90 BASE) MCG/ACT IN AERS: INHALATION_SPRAY | RESPIRATORY_TRACT | Status: DC

## 2014-01-30 NOTE — Progress Notes (Signed)
   Subjective:    Patient ID: Heather Austin, female    DOB: [birth year], 68 y.o.   MRN: [id number]  HPI The patient comes in today for followup of her known asthma. She has done fairly well since the last visit, and has not had an acute exacerbation. She denies overusing her rescue inhaler, although this is been a problem for her in the past. She denies any significant cough or mucus production. She does note some dyspnea with walking up hills, however admits that she does not challenge herself with physical activity.   Review of Systems  Constitutional: Negative for fever and unexpected weight change.  HENT: Negative for congestion, dental problem, ear pain, nosebleeds, postnasal drip, rhinorrhea, sinus pressure, sneezing, sore throat and trouble swallowing.   Eyes: Negative for redness and itching.  Respiratory: Positive for shortness of breath. Negative for cough, chest tightness and wheezing.   Cardiovascular: Negative for palpitations and leg swelling.  Gastrointestinal: Negative for nausea and vomiting.  Genitourinary: Negative for dysuria.  Musculoskeletal: Negative for joint swelling.  Skin: Negative for rash.  Neurological: Negative for headaches.  Hematological: Does not bruise/bleed easily.  Psychiatric/Behavioral: Negative for dysphoric mood. The patient is not nervous/anxious.        Objective:   Physical Exam Well-developed female in no acute distress Nose without purulence or discharge noted Neck without lymphadenopathy or thyromegaly Chest totally clear to auscultation, no wheezing Cardiac exam with regular rate and rhythm Lower extremities with no significant edema, no cyanosis Alert and oriented, moves all 4 extremities.       Assessment & Plan:

## 2014-01-30 NOTE — Addendum Note (Signed)
Addended by: Inge Rise on: 01/30/2014 10:40 AM   Modules accepted: Orders

## 2014-01-30 NOTE — Assessment & Plan Note (Signed)
The patient seems to be doing fairly well from an asthma standpoint, and I've asked her to continue working on a challenging conditioning program. She is on a very good bronchodilator regimen, and if she has increasing symptoms, we may need to approach this from an allergy standpoint.

## 2014-01-30 NOTE — Patient Instructions (Signed)
Continue on current asthma meds Work on improving your conditioning as we discussed. Will give you the flu shot today. Will send in a prescription for your albutero followup with me again in 64mos.

## 2014-03-04 DIAGNOSIS — R634 Abnormal weight loss: Secondary | ICD-10-CM | POA: Diagnosis not present

## 2014-03-04 DIAGNOSIS — M0609 Rheumatoid arthritis without rheumatoid factor, multiple sites: Secondary | ICD-10-CM | POA: Diagnosis not present

## 2014-03-04 DIAGNOSIS — M79643 Pain in unspecified hand: Secondary | ICD-10-CM | POA: Diagnosis not present

## 2014-03-14 ENCOUNTER — Other Ambulatory Visit: Payer: Self-pay | Admitting: Family Medicine

## 2014-03-14 DIAGNOSIS — R630 Anorexia: Secondary | ICD-10-CM | POA: Diagnosis not present

## 2014-03-14 DIAGNOSIS — I10 Essential (primary) hypertension: Secondary | ICD-10-CM | POA: Diagnosis not present

## 2014-03-14 DIAGNOSIS — E785 Hyperlipidemia, unspecified: Secondary | ICD-10-CM | POA: Diagnosis not present

## 2014-03-14 DIAGNOSIS — R634 Abnormal weight loss: Secondary | ICD-10-CM | POA: Diagnosis not present

## 2014-03-14 DIAGNOSIS — E559 Vitamin D deficiency, unspecified: Secondary | ICD-10-CM | POA: Diagnosis not present

## 2014-03-14 DIAGNOSIS — R0989 Other specified symptoms and signs involving the circulatory and respiratory systems: Secondary | ICD-10-CM

## 2014-03-26 ENCOUNTER — Other Ambulatory Visit: Payer: Medicare Other

## 2014-03-31 ENCOUNTER — Other Ambulatory Visit: Payer: Self-pay | Admitting: Pulmonary Disease

## 2014-04-02 ENCOUNTER — Ambulatory Visit
Admission: RE | Admit: 2014-04-02 | Discharge: 2014-04-02 | Disposition: A | Discharge status: Home / Self Care | Payer: Medicare Other | Source: Ambulatory Visit | Attending: Family Medicine | Admitting: Family Medicine

## 2014-04-02 DIAGNOSIS — I6522 Occlusion and stenosis of left carotid artery: Secondary | ICD-10-CM | POA: Diagnosis not present

## 2014-04-02 DIAGNOSIS — R0989 Other specified symptoms and signs involving the circulatory and respiratory systems: Secondary | ICD-10-CM

## 2014-04-02 IMAGING — US US CAROTID DUPLEX BILAT
1 series · 13 of 24 positions shown · non-contrast
Comparison: None.

CLINICAL DATA: Right carotid bruit.  History of hypertension.

EXAM:
BILATERAL CAROTID DUPLEX ULTRASOUND
TECHNIQUE: Gray scale imaging, color Doppler and duplex ultrasound were
performed of bilateral carotid and vertebral arteries in the neck.

[Series 1: us carotid duplex bilat · 0.09mm/px · 13 of 70 slices shown]
[im 1/70]
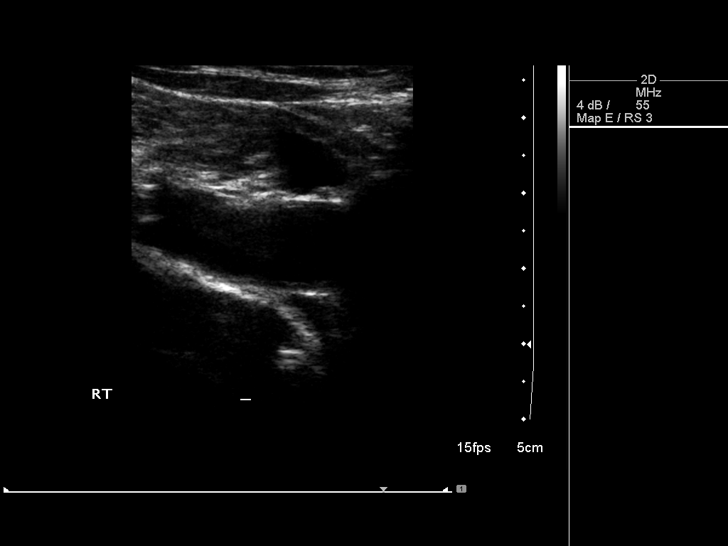
[im 7/70]
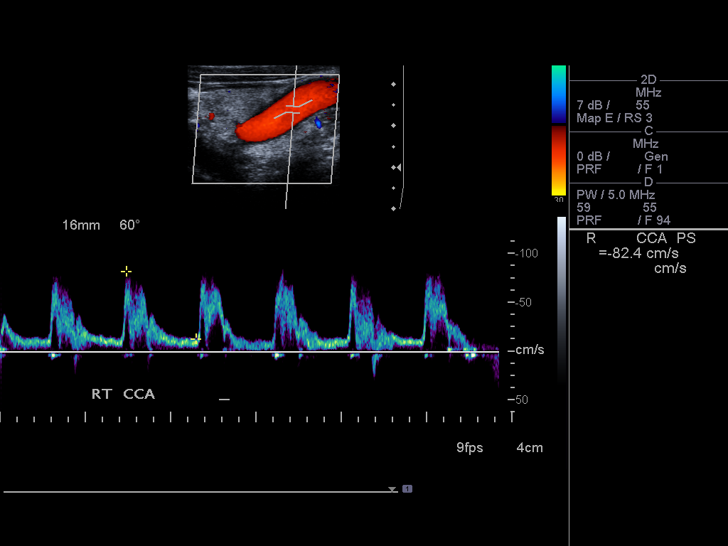
[im 13/70]
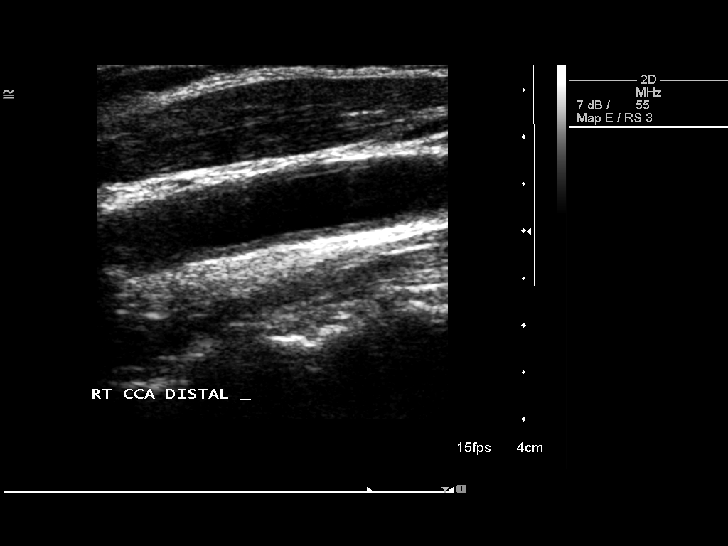
[im 19/70]
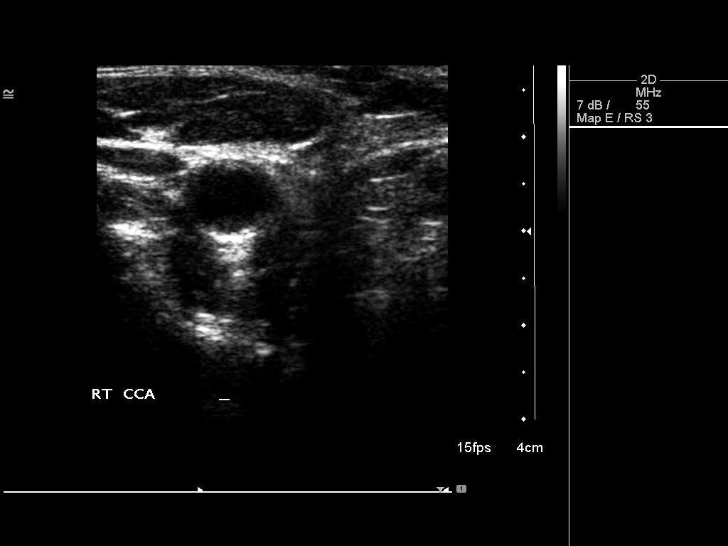
[im 25/70]
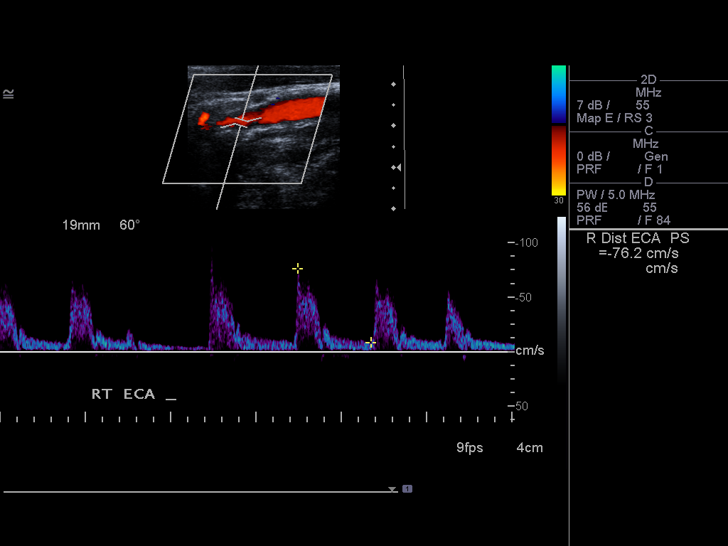
[im 31/70]
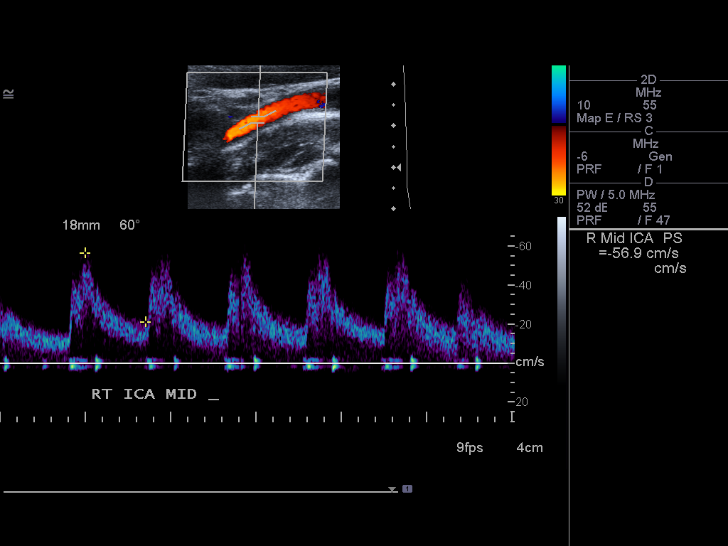
[im 37/70]
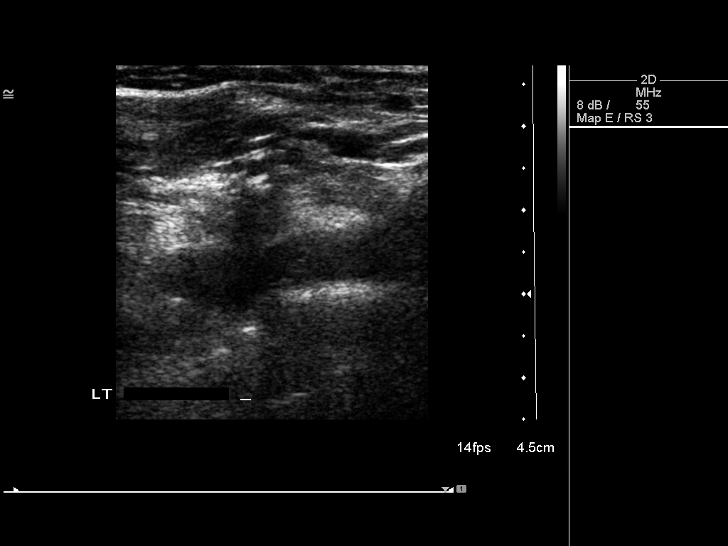
[im 40/70]
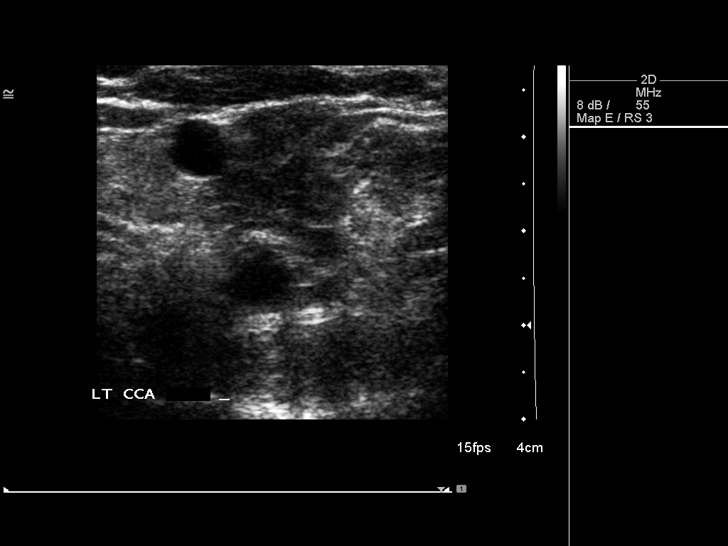
[im 46/70]
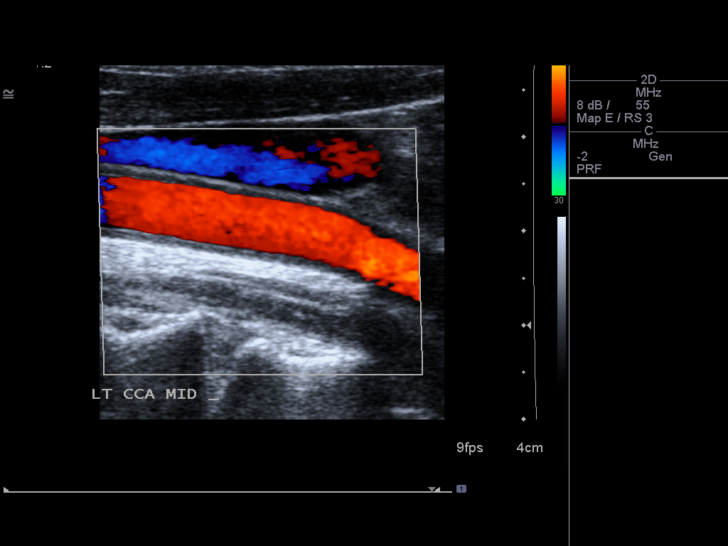
[im 52/70]
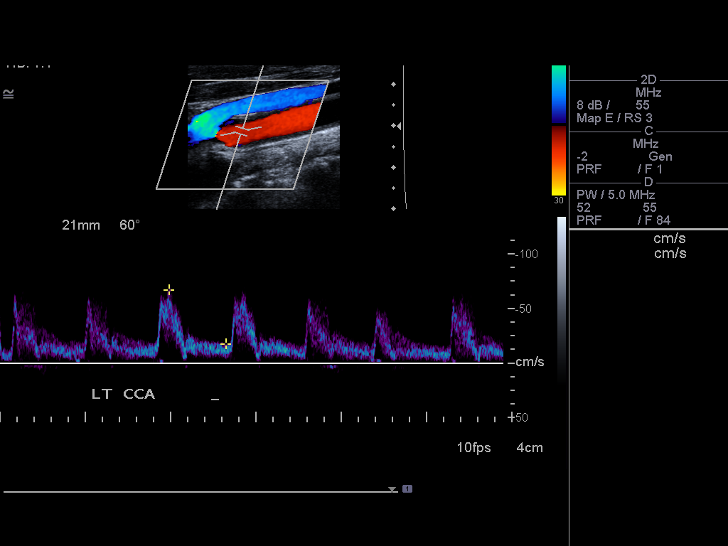
[im 58/70]
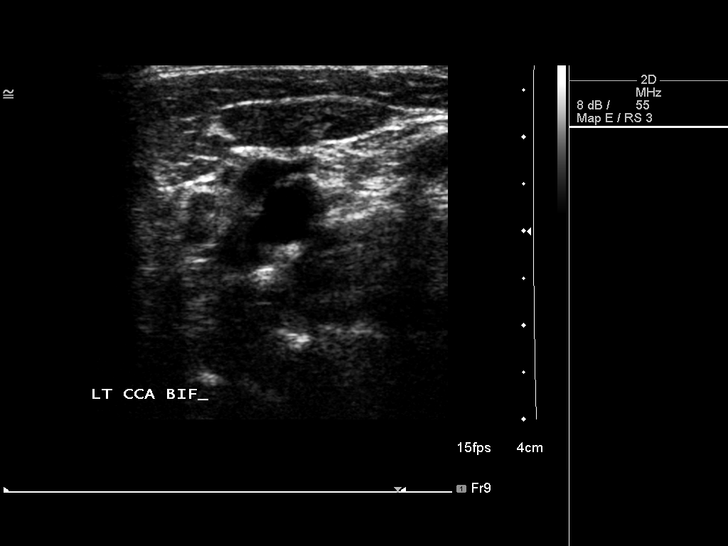
[im 64/70]
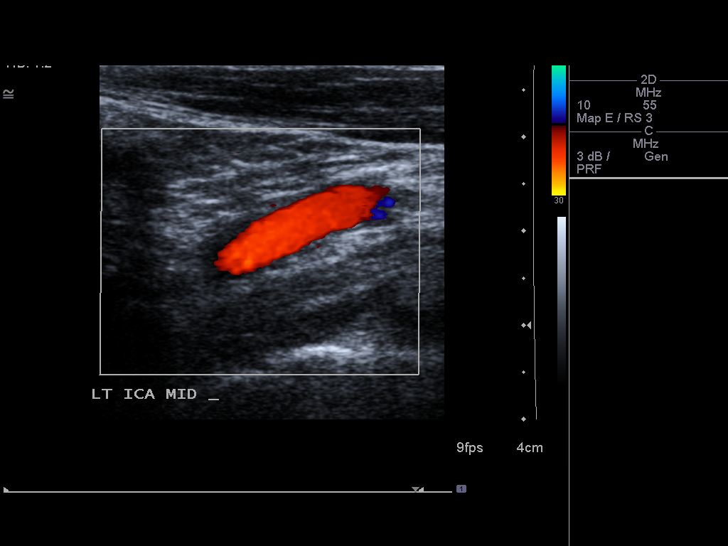
[im 70/70]
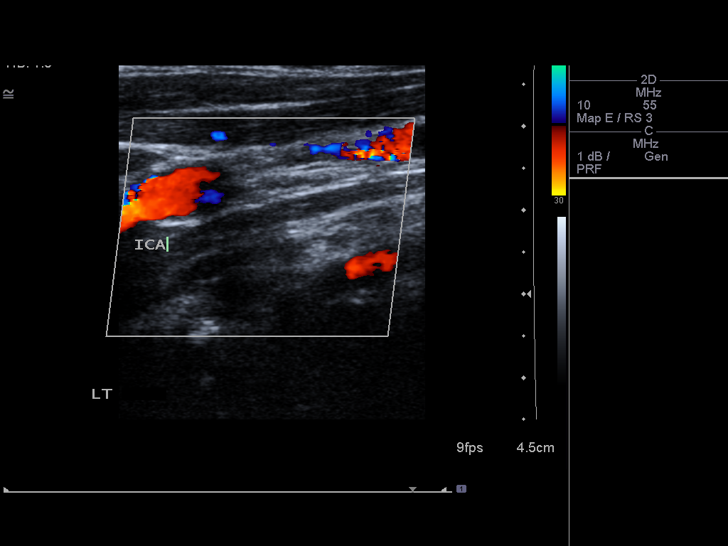

[13 of 24 positions shown; findings below may reference images not displayed]

FINDINGS: Criteria: Quantification of carotid stenosis is based on velocity
parameters that correlate the residual internal carotid diameter
with NASCET-based stenosis levels, using the diameter of the distal
internal carotid lumen as the denominator for stenosis measurement.

The following velocity measurements were obtained:

RIGHT

ICA:  49/12 cm/sec

CCA:  67/19 cm/sec

SYSTOLIC ICA/CCA RATIO:

DIASTOLIC ICA/CCA RATIO:

ECA:  76 cm/sec

LEFT

ICA:  68/24 cm/sec

CCA:  67/19 cm/sec

SYSTOLIC ICA/CCA RATIO:

DIASTOLIC ICA/CCA RATIO:

ECA:  78 cm/sec

RIGHT CAROTID ARTERY: The common carotid artery demonstrates intimal
thickening. No focal plaque is identified in the common carotid
artery, carotid bulb or internal carotid artery. Velocities and
waveforms are normal. No evidence of ICA stenosis.

RIGHT VERTEBRAL ARTERY: Antegrade flow with normal waveform and
velocity.

LEFT CAROTID ARTERY: Intimal thickening present in the common
carotid artery. There is a minimal amount of calcified plaque at the
level of the carotid bulb. No plaque is identified in the internal
carotid artery. There is no evidence of ICA stenosis.

LEFT VERTEBRAL ARTERY: Antegrade flow with normal waveform and
velocity.
IMPRESSION: No evidence of carotid stenosis in the neck. Intimal thickening
present in the common carotid arteries and minimal plaque present at
the level of the left carotid bulb.

## 2014-04-09 DIAGNOSIS — R630 Anorexia: Secondary | ICD-10-CM | POA: Diagnosis not present

## 2014-04-09 DIAGNOSIS — R634 Abnormal weight loss: Secondary | ICD-10-CM | POA: Diagnosis not present

## 2014-04-09 DIAGNOSIS — J45901 Unspecified asthma with (acute) exacerbation: Secondary | ICD-10-CM | POA: Diagnosis not present

## 2014-04-09 DIAGNOSIS — E559 Vitamin D deficiency, unspecified: Secondary | ICD-10-CM | POA: Diagnosis not present

## 2014-04-09 DIAGNOSIS — I10 Essential (primary) hypertension: Secondary | ICD-10-CM | POA: Diagnosis not present

## 2014-05-10 ENCOUNTER — Other Ambulatory Visit: Payer: Self-pay | Admitting: Pulmonary Disease

## 2014-05-14 ENCOUNTER — Telehealth: Payer: Self-pay | Admitting: Pulmonary Disease

## 2014-05-14 NOTE — Telephone Encounter (Signed)
PA initiated online through [email or web address] -- call wait for Medicare was over 37mins long to initiate via telephone Reference number is [id number] Will send to [place] to keep an eye out for letter of determination -- should receive in about 5 business days.

## 2014-05-16 ENCOUNTER — Ambulatory Visit
Admission: RE | Admit: 2014-05-16 | Discharge: 2014-05-16 | Disposition: A | Discharge status: Home / Self Care | Payer: Medicare Other | Source: Ambulatory Visit | Attending: Family Medicine | Admitting: Family Medicine

## 2014-05-16 ENCOUNTER — Other Ambulatory Visit: Payer: Self-pay | Admitting: Family Medicine

## 2014-05-16 DIAGNOSIS — J45909 Unspecified asthma, uncomplicated: Secondary | ICD-10-CM

## 2014-05-16 DIAGNOSIS — R634 Abnormal weight loss: Secondary | ICD-10-CM

## 2014-05-16 DIAGNOSIS — R062 Wheezing: Secondary | ICD-10-CM

## 2014-05-16 IMAGING — CR DG CHEST 2V
2 series · 2 of 2 positions shown · non-contrast
Comparison: [DATE].

CLINICAL DATA: Wheezing. Asthma, unspecified asthma severity,
uncomplicated.

EXAM:
CHEST  2 VIEW

[view not recorded (1 of 2)]
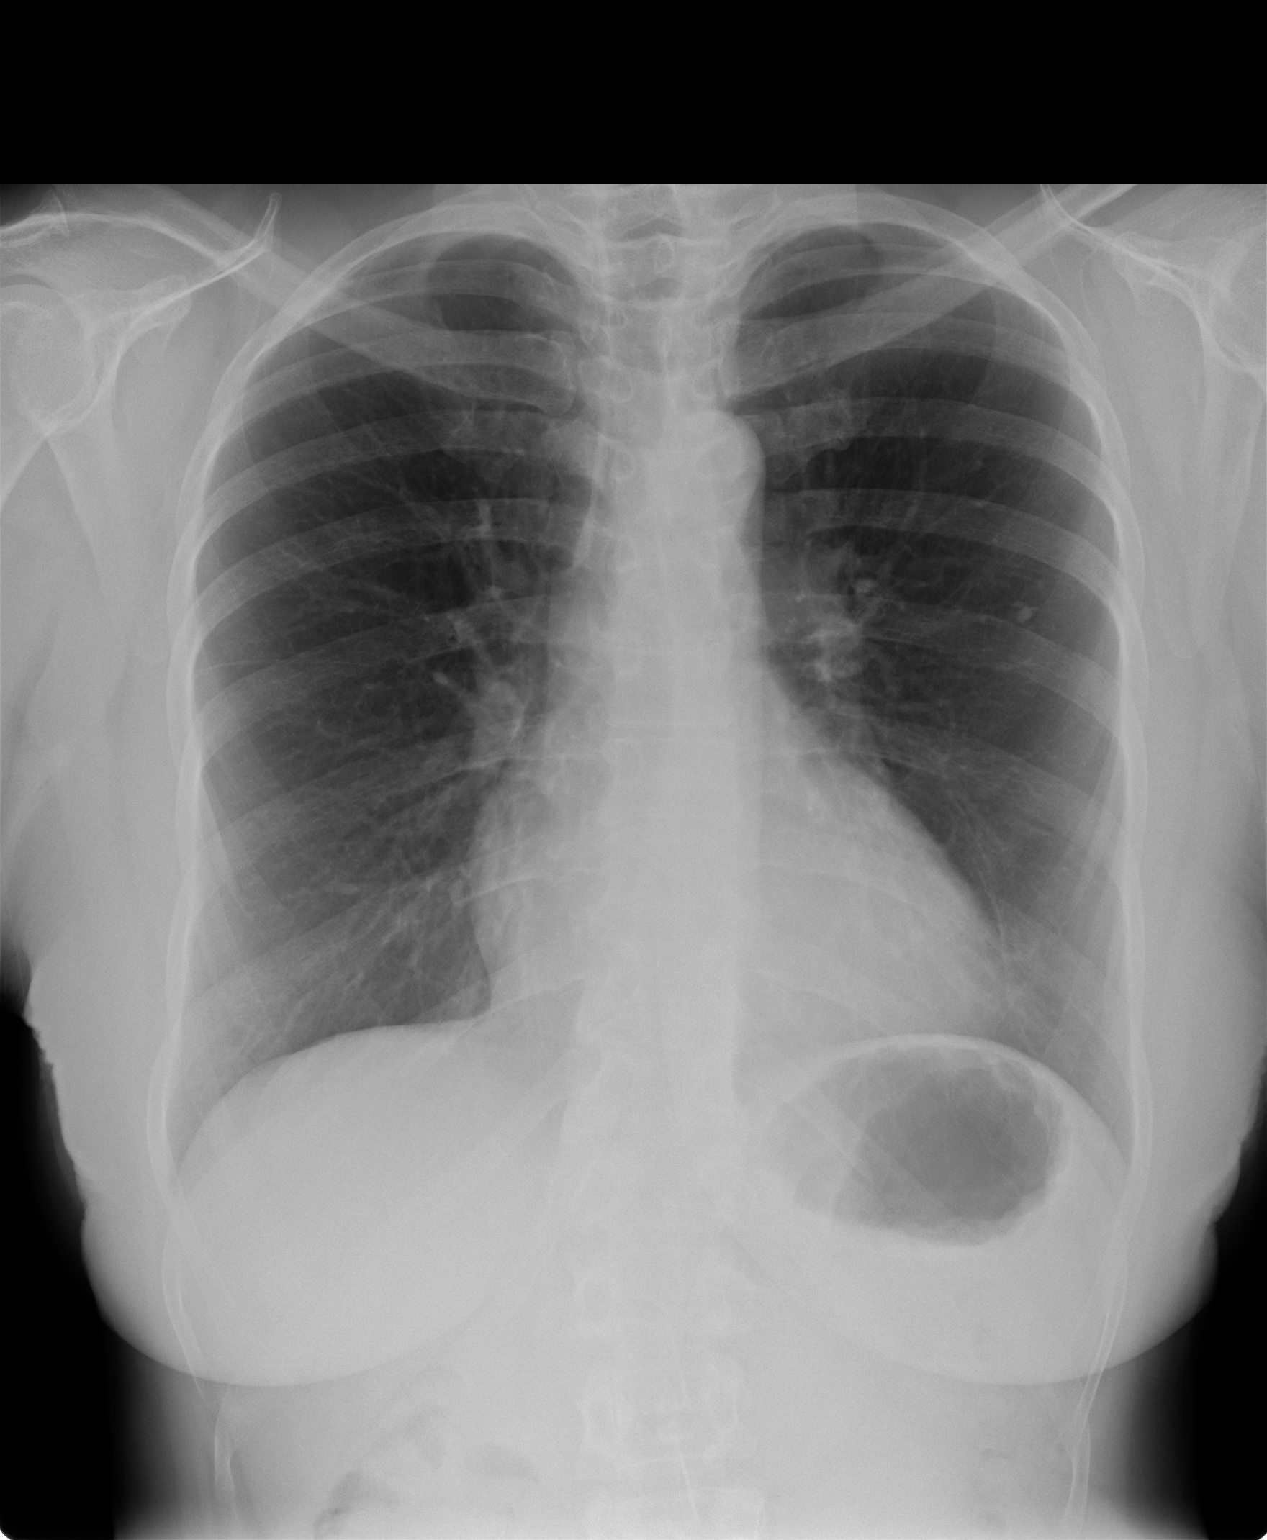

[view not recorded (2 of 2)]
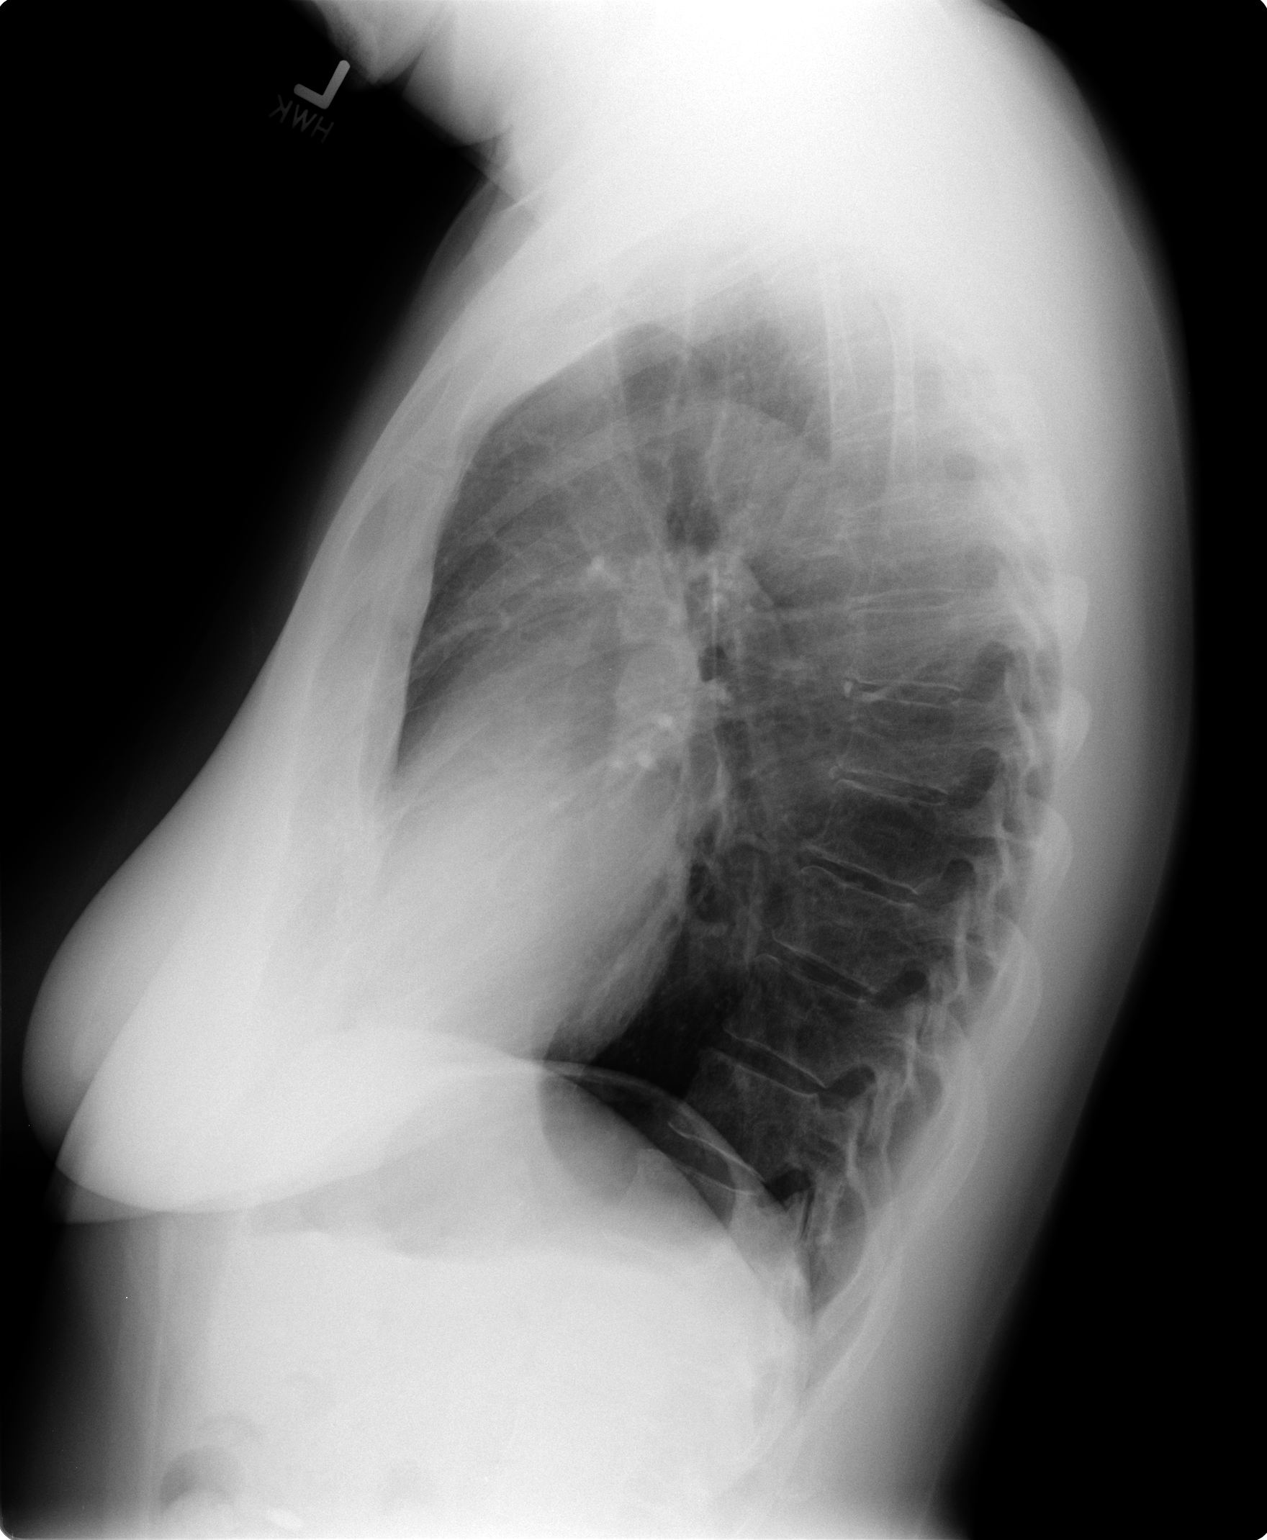

[2 of 2 positions shown; findings below may reference images not displayed]

FINDINGS: The heart size and mediastinal contours are within normal limits. No
pneumothorax or pleural effusion is noted. Stable calcified
granuloma is noted in left upper lobe. No acute pulmonary disease is
noted. The visualized skeletal structures are unremarkable.
IMPRESSION: No acute cardiopulmonary abnormality seen.

## 2014-05-20 NOTE — Telephone Encounter (Signed)
Mindy please advise status of PA or if medication was changed per [facility]. Thanks.

## 2014-05-21 NOTE — Telephone Encounter (Signed)
Pt needs to call insurance and see what is on her formulary/ alternative for dulera.  Called made pt aware. She will call them and let us know

## 2014-08-01 ENCOUNTER — Ambulatory Visit: Payer: Medicare Other | Admitting: Pulmonary Disease

## 2014-08-21 ENCOUNTER — Encounter: Payer: Self-pay | Admitting: Pulmonary Disease

## 2014-08-21 ENCOUNTER — Ambulatory Visit (INDEPENDENT_AMBULATORY_CARE_PROVIDER_SITE_OTHER): Payer: Medicare Other | Admitting: Pulmonary Disease

## 2014-08-21 VITALS — BP 122/70 | HR 67 | Temp 97.6°F | Ht 66.0 in | Wt 152.4 lb

## 2014-08-21 DIAGNOSIS — IMO0001 Reserved for inherently not codable concepts without codable children: Secondary | ICD-10-CM

## 2014-08-21 DIAGNOSIS — J4520 Mild intermittent asthma, uncomplicated: Secondary | ICD-10-CM | POA: Diagnosis not present

## 2014-08-21 NOTE — Progress Notes (Signed)
   Subjective:    Patient ID: Heather Austin, female    DOB: [birth year], 69 y.o.   MRN: [id number]  HPI Patient comes in today for follow-up of her known asthma. She is staying on her inhaler regimen compliantly, and for the most part has been doing fairly well. She has significant allergy symptoms this spring, and is having to overuse her rescue inhaler whenever she goes outside. She is staying on Singulair and an over-the-counter antihistamine, but it does not seem to be helping. She has not had an acute exacerbation since the last visit.   Review of Systems  Constitutional: Negative for fever and unexpected weight change.  HENT: Negative for congestion, dental problem, ear pain, nosebleeds, postnasal drip, rhinorrhea, sinus pressure, sneezing, sore throat and trouble swallowing.   Eyes: Negative for redness and itching.  Respiratory: Positive for shortness of breath and wheezing. Negative for cough and chest tightness.   Cardiovascular: Negative for palpitations and leg swelling.  Gastrointestinal: Negative for nausea and vomiting.  Genitourinary: Negative for dysuria.  Musculoskeletal: Negative for joint swelling.  Skin: Negative for rash.  Neurological: Negative for headaches.  Hematological: Does not bruise/bleed easily.  Psychiatric/Behavioral: Negative for dysphoric mood. The patient is not nervous/anxious.        Objective:   Physical Exam Thin female in no acute distress Nose without purulence or discharge noted Neck without lymphadenopathy or thyromegaly Chest with one isolated wheeze in the left upper lung zone anteriorly, otherwise clear Cardiac exam with regular rate and rhythm Lower extremities without edema, no cyanosis Alert and oriented, moves all 4 extremities.       Assessment & Plan:

## 2014-08-21 NOTE — Assessment & Plan Note (Signed)
The patient overall has done well on her current asthma medications, but this spurring she has had increased symptoms whenever she goes outside. She is having significant allergy symptoms despite eating on Singulair and an over-the-counter antihistamine, and most recently has had to use her rescue inhaler 4 times a day if she is outside. She has had an allergy evaluation a few years ago, and I think she would benefit from going back at this time.  She is to continue on her current asthma regimen.

## 2014-08-21 NOTE — Patient Instructions (Signed)
No change in your asthma medications Stay on your singulair and antihistamine for your allergies Will refer you to an allergist for further evaluation.  followup again in 78mos.

## 2014-10-31 ENCOUNTER — Other Ambulatory Visit: Payer: Self-pay | Admitting: *Deleted

## 2014-10-31 MED ORDER — MONTELUKAST SODIUM 10 MG PO TABS: 10.0000 mg | ORAL_TABLET | Freq: Every day | ORAL | Status: DC

## 2014-11-15 ENCOUNTER — Ambulatory Visit (INDEPENDENT_AMBULATORY_CARE_PROVIDER_SITE_OTHER): Payer: Medicare Other | Admitting: Internal Medicine

## 2014-11-15 ENCOUNTER — Encounter: Payer: Self-pay | Admitting: Internal Medicine

## 2014-11-15 VITALS — BP 158/88 | HR 74 | Ht 67.0 in | Wt 152.4 lb

## 2014-11-15 DIAGNOSIS — IMO0001 Reserved for inherently not codable concepts without codable children: Secondary | ICD-10-CM

## 2014-11-15 DIAGNOSIS — J4520 Mild intermittent asthma, uncomplicated: Secondary | ICD-10-CM

## 2014-11-15 NOTE — Progress Notes (Signed)
Subjective:    Patient ID: Heather Austin, female    DOB: 30-Sep-1945, 69 y.o.   MRN: [id number]  HPI 08/21/14- Dr [name] Patient comes in today for follow-up of her known asthma. She is staying on her inhaler regimen compliantly, and for the most part has been doing fairly well. She has significant allergy symptoms this spring, and is having to overuse her rescue inhaler whenever she goes outside. She is staying on Singulair and an over-the-counter antihistamine, but it does not seem to be helping. She has not had an acute exacerbation since the last visit.  11/15/14- 36 yoF never smoker followed for asthma, transferring care from Dr [name] For: Occ SOB and wheezing when walking at a certain speed. Breathing is also affected by heat and pollen. Former [place] pt. She is coming to establish with me at this visit and denies acute problems. She has had asthma since childhood. Much more stable in recent years, feeling well controlled on current meds. Uses rescue inhaler about twice a week. Denies sleep disturbance by asthma. Uses Dulera daily as directed. Triggers include Spring greater than fall pollen seasons, weather changes, colds. She denies significant rhinitis symptoms. Allergic to latex no recognized problem with aspirin. Allergy Profile 04/04/2012 -total IgE 439.4 with elevations for most specific allergens tested on the panel. Office Spirometry- 08/01/14 moderate obstructive airways disease. FVC 2.12/79%, FEV1 1.12/55%, FEV1/FVC 0.53, FEF 25-75 percent 0.47/23%. CXR 05/16/14 IMPRESSION: No acute cardiopulmonary abnormality seen. Electronically Signed  By: Sabino Dick M.D.  On: 05/16/2014 11:15  Prior to Admission medications   Medication Sig Start Date End Date Taking? Authorizing Provider  albuterol (PROAIR HFA) 108 (90 BASE) MCG/ACT inhaler inhale 2 puffs every 6 hours if needed 01/30/14  Yes Kathee Delton, MD  DULERA 200-5 MCG/ACT AERO inhale 2 puffs by mouth twice a day 05/10/14   Yes Kathee Delton, MD  ergocalciferol (VITAMIN D2) 50000 UNITS capsule Take 50,000 Units by mouth once a week.   Yes Historical Provider, MD  leflunomide (ARAVA) 20 MG tablet Take 20 mg by mouth daily. 10/20/14  Yes Historical Provider, MD  montelukast (SINGULAIR) 10 MG tablet Take 1 tablet (10 mg total) by mouth at bedtime. 10/31/14  Yes Tammy S Parrett, NP  NIFEdipine (PROCARDIA XL/ADALAT-CC) 90 MG 24 hr tablet Take 90 mg by mouth daily. 09/04/14  Yes Historical Provider, MD  potassium chloride (K-DUR) 10 MEQ tablet Take 10 mEq by mouth daily. 09/04/14  Yes Historical Provider, MD  valsartan (DIOVAN) 320 MG tablet Take 320 mg by mouth. 09/08/14  Yes Historical Provider, MD   Past Medical History  Diagnosis Date  . Asthma   . Multiple joint pain   . HTN (hypertension), benign   . Hyperlipidemia    No past surgical history on file. Family History  Problem Relation Age of Onset  . Asthma Sister   . Allergies Son   . Allergies Daughter    History   Social History  . Marital Status: Single    Spouse Name: N/A  . Number of Children: N/A  . Years of Education: N/A   Occupational History  . Not on file.   Social History Main Topics  . Smoking status: Never Smoker   . Smokeless tobacco: Never Used  . Alcohol Use: No  . Drug Use: No  . Sexual Activity: Not on file   Other Topics Concern  . Not on file   Social History Narrative   ROS-see HPI   Negative unless "+"  Constitutional:    weight loss, night sweats, fevers, chills, fatigue, lassitude. HEENT:    headaches, difficulty swallowing, +tooth/dental problems, sore throat,       sneezing, itching, ear ache, nasal congestion, post nasal drip, snoring CV:    chest pain, orthopnea, PND, swelling in lower extremities, anasarca,                                                     dizziness, palpitations Resp:   +shortness of breath with exertion or at rest.                productive cough,   non-productive cough, coughing up of  blood.              change in color of mucus.  wheezing.   Skin:    rash or lesions. GI:  No-   heartburn, indigestion, abdominal pain, nausea, vomiting, diarrhea,                 change in bowel habits, loss of appetite GU: dysuria, change in color of urine, no urgency or frequency.   flank pain. MS:   joint pain, stiffness, decreased range of motion, back pain. Neuro-     nothing unusual Psych:  change in mood or affect.  depression or anxiety.   memory loss.  OBJ- Physical Exam General- Alert, Oriented, Affect-appropriate, Distress- none acute Skin- rash-none, lesions- none, excoriation- none Lymphadenopathy- none Head- atraumatic            Eyes- Gross vision intact, PERRLA, conjunctivae and secretions clear            Ears- Hearing, canals-normal            Nose- Clear, no-Septal dev, mucus, polyps, erosion, perforation             Throat- Mallampati II-III , mucosa clear , drainage- none, tonsils- atrophic Neck- flexible , trachea midline, no stridor , thyroid nl, carotid no bruit Chest - symmetrical excursion , unlabored           Heart/CV- RRR , no murmur , no gallop  , no rub, nl s1 s2                           - JVD- none , edema- none, stasis changes- none, varices- none           Lung- clear to P&A, wheeze- none, cough- none , dullness-none, rub- none           Chest wall-  Abd-  Br/ Gen/ Rectal- Not done, not indicated Extrem- cyanosis- none, clubbing, none, atrophy- none, strength- nl Neuro- grossly intact to observation       Objective:         Assessment & Plan:

## 2014-11-15 NOTE — Patient Instructions (Signed)
We can continue current meds  Please call if we can help 

## 2014-11-15 NOTE — Assessment & Plan Note (Signed)
Moderate persistent obstruction with enough variation to support impression of asthma or perhaps asthma with COPD. 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 There seems to be a significant allergic component. We don't have an eosinophil level recently. She feels well controlled at the present time, but more allergy intervention including possibly Xolair or Nucala could be available to her if needed later.

## 2015-01-09 ENCOUNTER — Other Ambulatory Visit: Payer: Self-pay | Admitting: Internal Medicine

## 2015-05-26 ENCOUNTER — Other Ambulatory Visit: Payer: Self-pay | Admitting: Adult Health

## 2015-05-26 MED ORDER — MONTELUKAST SODIUM 10 MG PO TABS: 10.0000 mg | ORAL_TABLET | Freq: Every day | ORAL | Status: DC

## 2015-05-26 NOTE — Telephone Encounter (Signed)
Received paper refill request from [facility] on [address] for  singular 10mg  Take 1 tab by mouth at bedtime #30 with 5 refills Last refilled on 10/31/14 by TP Last ov with CY on 11/15/14  Patient Instructions      We can continue current meds.  Please call if we can help   Rx sent to pharmacy electronically. Nothing further needed.

## 2015-06-16 ENCOUNTER — Telehealth: Payer: Self-pay | Admitting: Internal Medicine

## 2015-06-16 NOTE — Telephone Encounter (Signed)
Spoke with patient, scheduled for OV with TP 06/19/15 at 1045. Nothing further needed.

## 2015-06-16 NOTE — Telephone Encounter (Signed)
Pt is requesting an OV this week to discuss her medications.  Pt reports her Albuterol medications are not lasting as long and it should be - running out before the month is up.  Pt states that her medications may need to changed d/t increased symptoms and more frequent use.  Pt having increased SOB. Please advise Dr Annamaria Boots where the patient can be worked in this week.     Medication List       This list is accurate as of: 06/16/15 10:40 AM.  Always use your most recent med list.               DULERA 200-5 MCG/ACT Aero  Generic drug:  mometasone-formoterol  inhale 2 puffs by mouth twice a day     ergocalciferol 50000 units capsule  Commonly known as:  VITAMIN D2  Take 50,000 Units by mouth once a week.     leflunomide 20 MG tablet  Commonly known as:  ARAVA  Take 20 mg by mouth daily.     montelukast 10 MG tablet  Commonly known as:  SINGULAIR  Take 1 tablet (10 mg total) by mouth at bedtime.     NIFEdipine 90 MG 24 hr tablet  Commonly known as:  PROCARDIA XL/ADALAT-CC  Take 90 mg by mouth daily.     potassium chloride 10 MEQ tablet  Commonly known as:  K-DUR  Take 10 mEq by mouth daily.     PROAIR HFA 108 (90 Base) MCG/ACT inhaler  Generic drug:  albuterol  inhale 2 puffs by mouth every 6 hours if needed     valsartan 320 MG tablet  Commonly known as:  DIOVAN  Take 320 mg by mouth.

## 2015-06-16 NOTE — Telephone Encounter (Signed)
See if we can work her in with an NP this week

## 2015-06-19 ENCOUNTER — Encounter: Payer: Self-pay | Admitting: Adult Health

## 2015-06-19 ENCOUNTER — Ambulatory Visit (INDEPENDENT_AMBULATORY_CARE_PROVIDER_SITE_OTHER)
Admission: RE | Admit: 2015-06-19 | Discharge: 2015-06-19 | Disposition: A | Discharge status: Home / Self Care | Payer: Medicare Other | Source: Ambulatory Visit | Attending: Adult Health | Admitting: Adult Health

## 2015-06-19 ENCOUNTER — Ambulatory Visit (INDEPENDENT_AMBULATORY_CARE_PROVIDER_SITE_OTHER): Payer: Medicare Other | Admitting: Adult Health

## 2015-06-19 VITALS — BP 140/84 | HR 79 | Temp 97.7°F | Ht 66.5 in | Wt 154.4 lb

## 2015-06-19 DIAGNOSIS — J4520 Mild intermittent asthma, uncomplicated: Secondary | ICD-10-CM | POA: Diagnosis not present

## 2015-06-19 DIAGNOSIS — R0602 Shortness of breath: Secondary | ICD-10-CM

## 2015-06-19 DIAGNOSIS — IMO0001 Reserved for inherently not codable concepts without codable children: Secondary | ICD-10-CM

## 2015-06-19 LAB — NITRIC OXIDE: Nitric Oxide: 20

## 2015-06-19 IMAGING — DX DG CHEST 2V
2 series · 2 of 2 positions shown · non-contrast
Comparison: PA and lateral chest x-ray [DATE]st [facility]
[TT]

CLINICAL DATA: Shortness of breath [DATE]; history of
asthma, nonsmoker.

EXAM:
CHEST  2 VIEW

[chest pa]
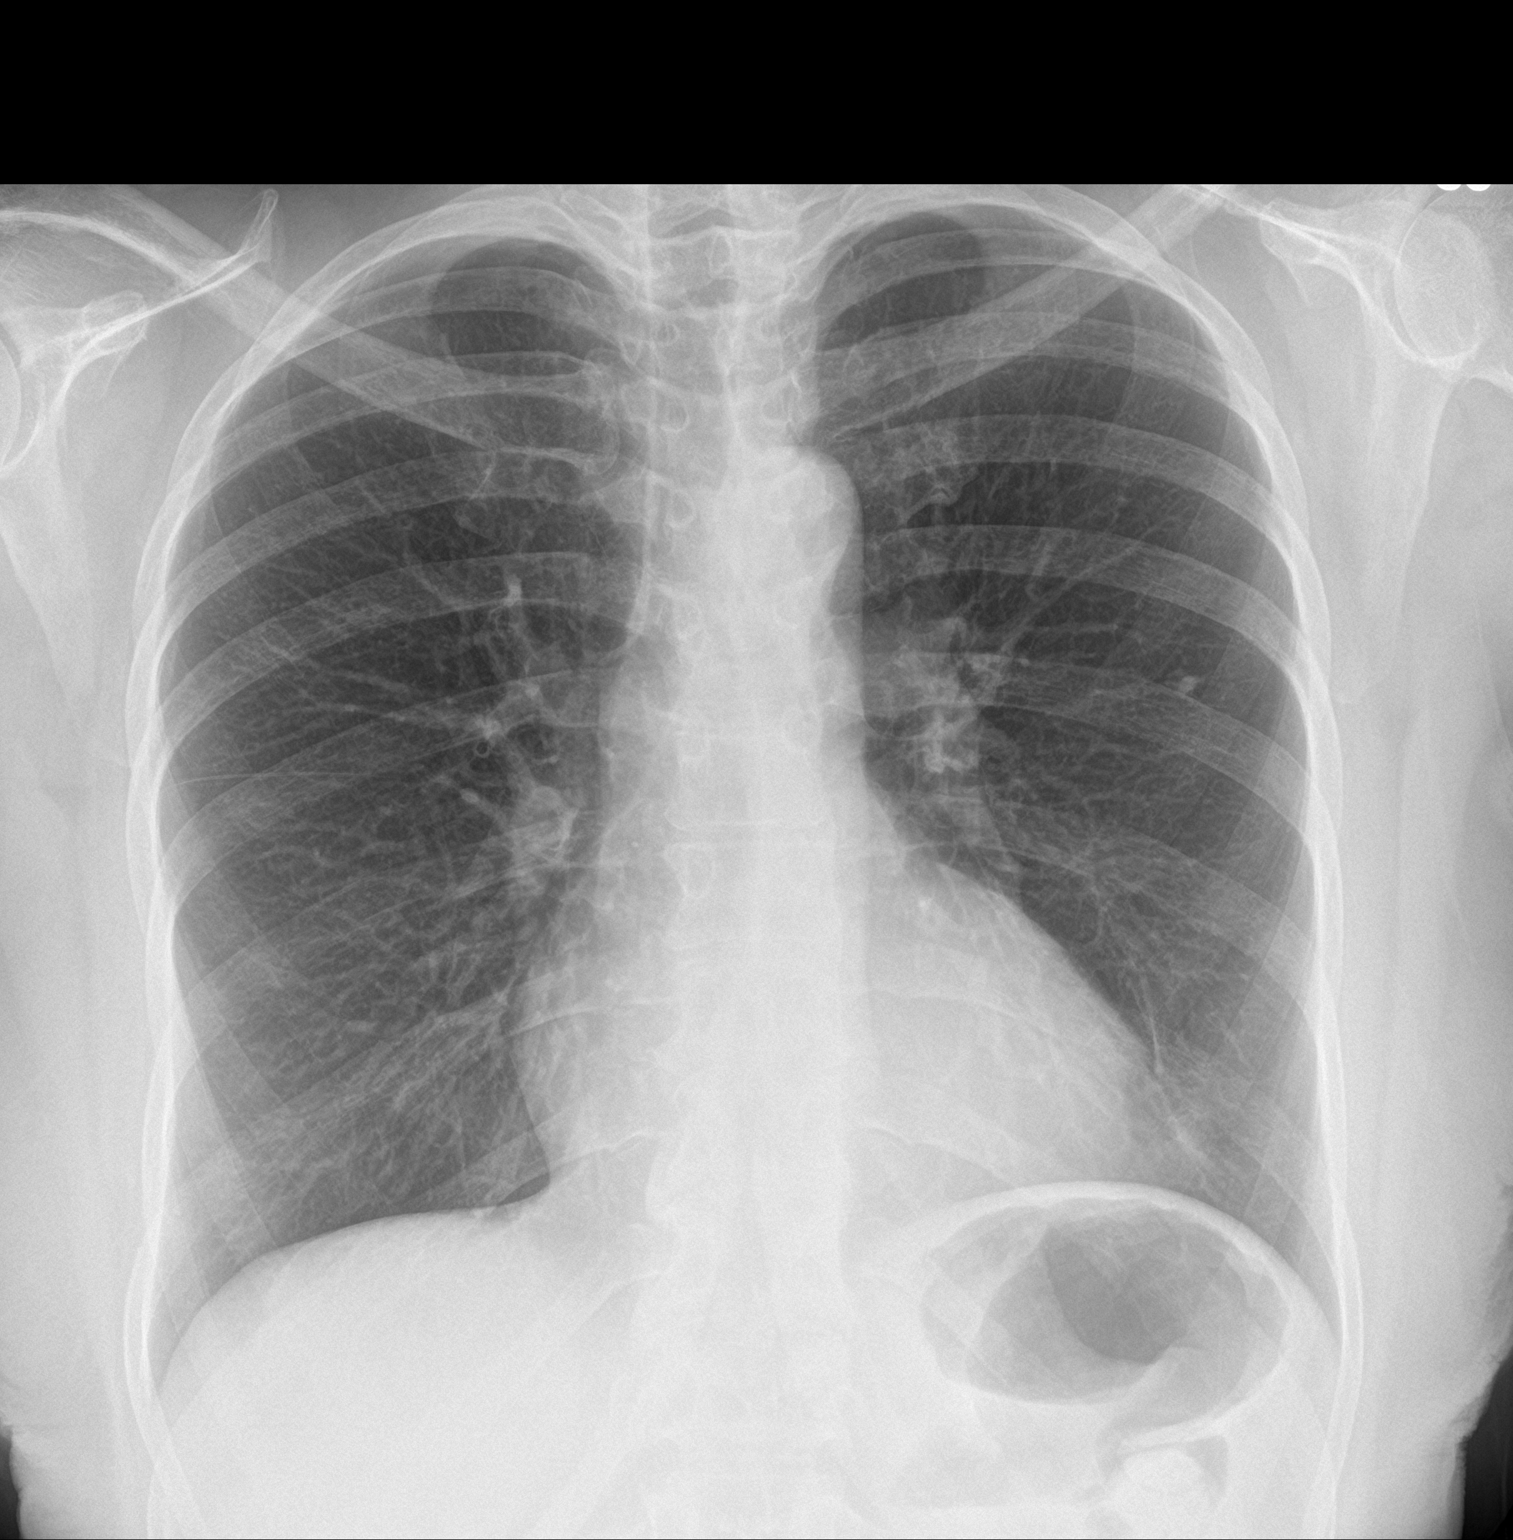

[chest lat]
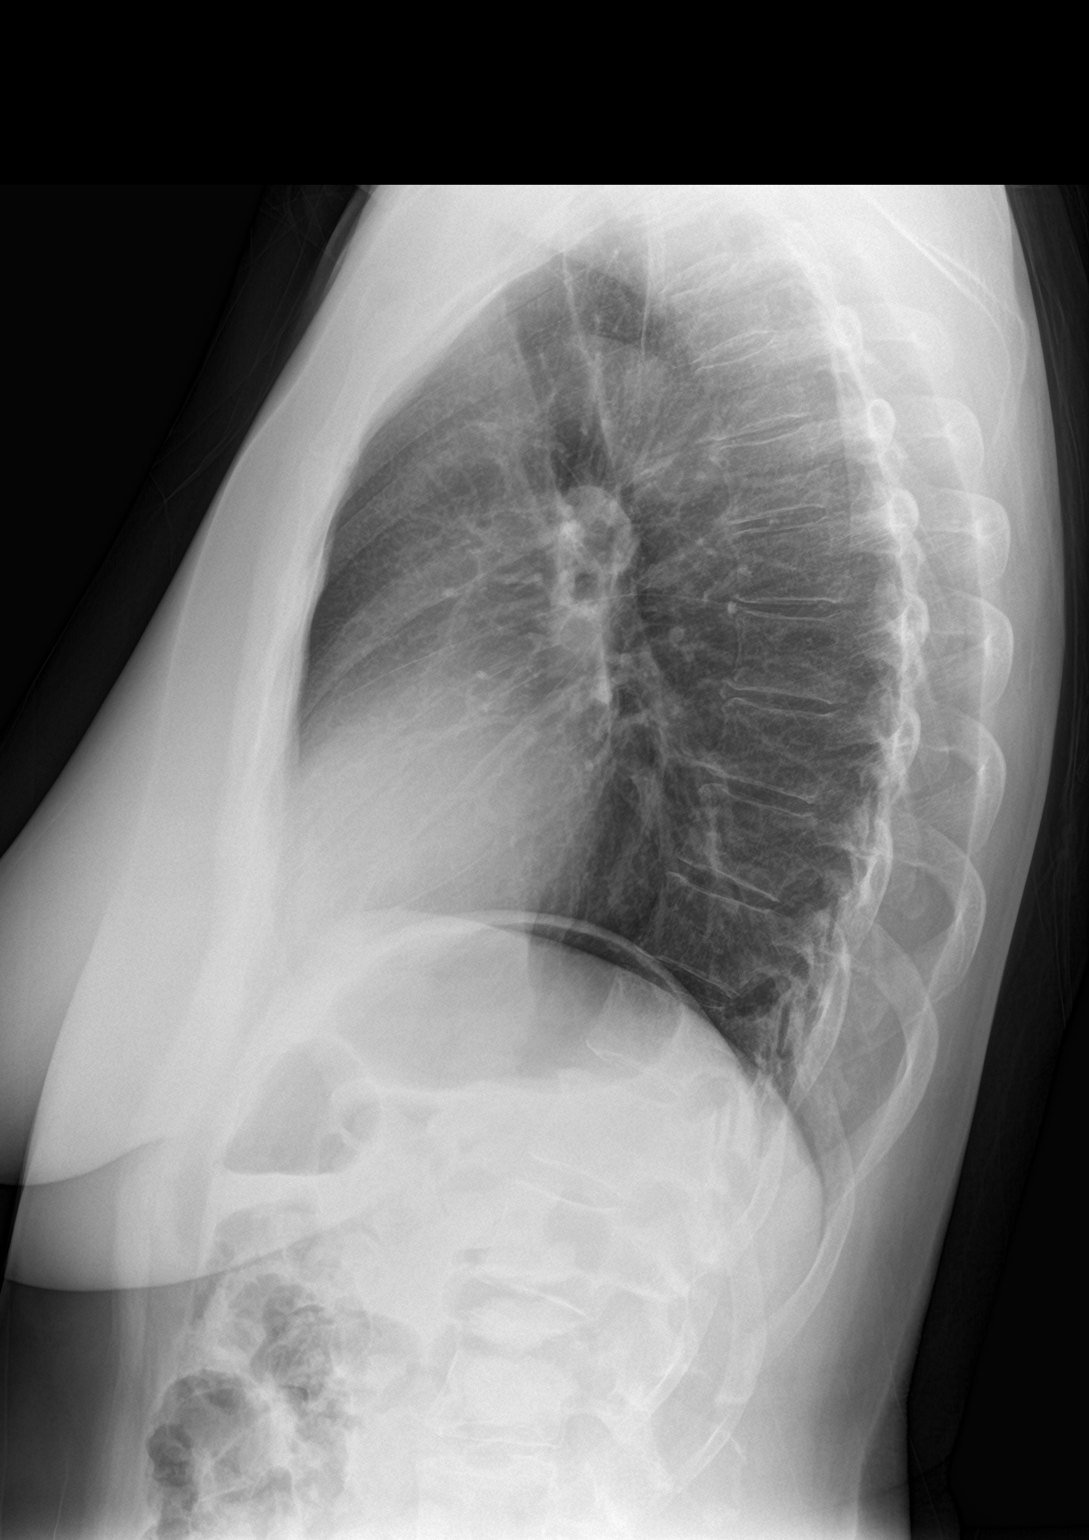

[2 of 2 positions shown; findings below may reference images not displayed]

FINDINGS: The lungs are mildly hyperinflated and clear. There is a stable 3 mm
diameter calcified nodule in the left mid lung. There is no alveolar
infiltrate. There is no pleural effusion. The heart and pulmonary
vascularity are normal. The mediastinum is normal in width. The bony
thorax is unremarkable.
IMPRESSION: There is no active cardiopulmonary disease.

## 2015-06-19 MED ORDER — PREDNISONE 10 MG PO TABS: ORAL_TABLET | ORAL | Status: DC

## 2015-06-19 NOTE — Progress Notes (Signed)
Subjective:    Patient ID: Heather Austin, female    DOB: May 24, 1945, 70 y.o.   MRN: [id number]  HPI 70 yo female never smoker with asthma  Has RA on Arava.   TEST  PFT's 03/2012:  FEV1 1.30, ratio 53, TLC normal,  DLCO 73%.  Allergy Profile 04/04/2012-total IgE 439.4 with significant elevations for almost everything on the panel PFT's 03/2013:  FEV1 1.30 (60%), ratio 60, BD with 15% increase FEV1, normal TLC, DLCO 73%.   Arlyce Harman 07/2013:  FEV1 1.12 (55%), ratio 53    06/19/2015 Acute OV  Pt presents for an acute office visit.  Complains over last 4 months her breathing has been getting worse with wheezing and cough.  Currently on Dulera Twice daily  . Using proair few times a day.  Cough is mainly dry . No discolored mucus or fever.  Denies chest pain, orthopnea, edema or hemoptysis.  She does have RA on [place]. No recent methotrexate use.  Spirometry today reviewed with pt is similar to preivous with moderate obstruction  FEV1 64% , ratio 58, FVC 82%.      Past Medical History  Diagnosis Date  . Asthma   . Multiple joint pain   . HTN (hypertension), benign   . Hyperlipidemia    Current Outpatient Prescriptions on File Prior to Visit  Medication Sig Dispense Refill  . DULERA 200-5 MCG/ACT AERO inhale 2 puffs by mouth twice a day 13 g 6  . ergocalciferol (VITAMIN D2) 50000 UNITS capsule Take 50,000 Units by mouth once a week.    . leflunomide (ARAVA) 20 MG tablet Take 20 mg by mouth daily.  0  . montelukast (SINGULAIR) 10 MG tablet Take 1 tablet (10 mg total) by mouth at bedtime. 30 tablet 5  . NIFEdipine (PROCARDIA XL/ADALAT-CC) 90 MG 24 hr tablet Take 90 mg by mouth daily.  0  . potassium chloride (K-DUR) 10 MEQ tablet Take 10 mEq by mouth daily.  0  . PROAIR HFA 108 (90 BASE) MCG/ACT inhaler inhale 2 puffs by mouth every 6 hours if needed 8.5 Inhaler 6  . valsartan (DIOVAN) 320 MG tablet Take 320 mg by mouth.  0   No current facility-administered medications on file prior  to visit.      Review of Systems    Constitutional:   No  weight loss, night sweats,  Fevers, chills, fatigue, or  lassitude.  HEENT:   No headaches,  Difficulty swallowing,  Tooth/dental problems, or  Sore throat,                No sneezing, itching, ear ache,  +nasal congestion, post nasal drip,   CV:  No chest pain,  Orthopnea, PND, swelling in lower extremities, anasarca, dizziness, palpitations, syncope.   GI  No heartburn, indigestion, abdominal pain, nausea, vomiting, diarrhea, change in bowel habits, loss of appetite, bloody stools.   Resp:    No chest wall deformity  Skin: no rash or lesions.  GU: no dysuria, change in color of urine, no urgency or frequency.  No flank pain, no hematuria   MS:  No joint pain or swelling.  No decreased range of motion.  No back pain.  Psych:  No change in mood or affect. No depression or anxiety.  No memory loss.      Objective:   Physical Exam   Filed Vitals:   06/19/15 1025  BP: 140/84  Pulse: 79  Temp: 97.7 F (36.5 C)  TempSrc: Oral  Height:  5' 6.5" (1.689 m)  Weight: 154 lb 6.4 oz (70.035 kg)  SpO2: 97%   GEN: A/Ox3; pleasant , NAD   HEENT:  [place]/AT,  EACs-clear, TMs-wnl, NOSE-clear, THROAT-clear, no lesions, no postnasal drip or exudate noted.   NECK:  Supple w/ fair ROM; no JVD; normal carotid impulses w/o bruits; no thyromegaly or nodules palpated; no lymphadenopathy.  RESP  Clear  P & A; w/o, wheezes/ rales/ or rhonchi.no accessory muscle use, no dullness to percussion  CARD:  RRR, no m/r/g  , no peripheral edema, pulses intact, no cyanosis or clubbing.  GI:   Soft & nt; nml bowel sounds; no organomegaly or masses detected.  Musco: Warm bil, no deformities or joint swelling noted.   Neuro: alert, no focal deficits noted.    Skin: Warm, no lesions or rashes         Assessment & Plan:

## 2015-06-19 NOTE — Patient Instructions (Signed)
Prednisone taper over next week.  Continue on Dulera 2 puffs Twice daily , rinse after use.  Use ProAir As needed  Only , it is your rescue inhaler .  Chest xray today  follow up Dr. Annamaria Boots  In 6 weeks and As needed   Please contact office for sooner follow up if symptoms do not improve or worsen or seek emergency care

## 2015-06-19 NOTE — Assessment & Plan Note (Signed)
Flare  Check cxr today   Plan  Prednisone taper over next week.  Continue on Dulera 2 puffs Twice daily , rinse after use.  Use ProAir As needed  Only , it is your rescue inhaler .  Chest xray today  follow up Dr. Annamaria Boots  In 6 weeks and As needed   Please contact office for sooner follow up if symptoms do not improve or worsen or seek emergency care

## 2015-06-20 ENCOUNTER — Telehealth: Payer: Self-pay | Admitting: Internal Medicine

## 2015-06-20 MED ORDER — MOMETASONE FURO-FORMOTEROL FUM 200-5 MCG/ACT IN AERO: 2.0000 | INHALATION_SPRAY | Freq: Two times a day (BID) | RESPIRATORY_TRACT | Status: DC

## 2015-06-20 MED ORDER — ALBUTEROL SULFATE HFA 108 (90 BASE) MCG/ACT IN AERS: INHALATION_SPRAY | RESPIRATORY_TRACT | Status: DC

## 2015-06-20 NOTE — Telephone Encounter (Signed)
Rx's were sent in this AM to the pharmacy called made pt aware. She needed nothing further

## 2015-06-20 NOTE — Telephone Encounter (Signed)
RX's have been sent in for dulera and proair. Nothing further needed

## 2015-06-23 ENCOUNTER — Telehealth: Payer: Self-pay | Admitting: *Deleted

## 2015-06-23 NOTE — Telephone Encounter (Signed)
Submitted  PA thru [facility]. Key: [id number] Pt ID: [id number] [place] (p) [phone]  Submitted for review.

## 2015-06-23 NOTE — Progress Notes (Signed)
Quick Note:  Called and spoke with the pt. Reviewed results and recs. Pt voiced understanding and had no further questions. ______ 

## 2015-06-23 NOTE — Telephone Encounter (Signed)
Dulera approved until 04/25/2016. [id number]  Pharmacy informed.

## 2015-11-17 ENCOUNTER — Encounter: Payer: Self-pay | Admitting: Internal Medicine

## 2015-11-17 ENCOUNTER — Ambulatory Visit (INDEPENDENT_AMBULATORY_CARE_PROVIDER_SITE_OTHER): Payer: Medicare Other | Admitting: Internal Medicine

## 2015-11-17 DIAGNOSIS — J4520 Mild intermittent asthma, uncomplicated: Secondary | ICD-10-CM | POA: Diagnosis not present

## 2015-11-17 DIAGNOSIS — IMO0001 Reserved for inherently not codable concepts without codable children: Secondary | ICD-10-CM

## 2015-11-17 MED ORDER — PREDNISONE 10 MG PO TABS: ORAL_TABLET | ORAL | 0 refills | Status: DC

## 2015-11-17 NOTE — Patient Instructions (Signed)
Script sent for prednisone to have on hand as discussed  Continue your regular meds  Please call as needed

## 2015-11-17 NOTE — Progress Notes (Signed)
Subjective:    Patient ID: Heather Austin, female    DOB: 02/24/1946, 70 y.o.   MRN: [id number]  HPI 08/21/14- Dr Heather Austin Greet Patient comes in today for follow-up of her known asthma. She is staying on her inhaler regimen compliantly, and for the most part has been doing fairly well. She has significant allergy symptoms this spring, and is having to overuse her rescue inhaler whenever she goes outside. She is staying on Singulair and an over-the-counter antihistamine, but it does not seem to be helping. She has not had an acute exacerbation since the last visit.  11/15/14- 59 yoF never smoker followed for asthma, transferring care from Dr Heather Austin For: Occ SOB and wheezing when walking at a certain speed. Breathing is also affected by heat and pollen. Former [place] pt. She is coming to establish with me at this visit and denies acute problems. She has had asthma since childhood. Much more stable in recent years, feeling well controlled on current meds. Uses rescue inhaler about twice a week. Denies sleep disturbance by asthma. Uses Dulera daily as directed. Triggers include Spring greater than fall pollen seasons, weather changes, colds. She denies significant rhinitis symptoms. Allergic to latex no recognized problem with aspirin. Allergy Profile 04/04/2012 -total IgE 439.4 with elevations for most specific allergens tested on the panel. Office Spirometry- 08/01/14 moderate obstructive airways disease. FVC 2.12/79%, FEV1 1.12/55%, FEV1/FVC 0.53, FEF 25-75 percent 0.47/23%. CXR 05/16/14 IMPRESSION: No acute cardiopulmonary abnormality seen. Electronically Signed  By: Sabino Dick M.D.  On: 05/16/2014 11:15  11/17/2015-70 year old female never smoker followed for asthma FOLLOWS FOR: Pt last seen by TP 06-19-15; Pt states she stays indoors mostly due to heat and continues to have SOB with activity.  CXR 06/19/2015-IMPRESSION: There is no active cardiopulmonary disease.   ROS-see HPI   Negative  unless "+" Constitutional:    weight loss, night sweats, fevers, chills, fatigue, lassitude. HEENT:    headaches, difficulty swallowing, +tooth/dental problems, sore throat,       sneezing, itching, ear ache, nasal congestion, post nasal drip, snoring CV:    chest pain, orthopnea, PND, swelling in lower extremities, anasarca,                                                     dizziness, palpitations Resp:   +shortness of breath with exertion or at rest.                productive cough,   non-productive cough, coughing up of blood.              change in color of mucus.  wheezing.   Skin:    rash or lesions. GI:  No-   heartburn, indigestion, abdominal pain, nausea, vomiting, diarrhea,                 change in bowel habits, loss of appetite GU: dysuria, change in color of urine, no urgency or frequency.   flank pain. MS:   joint pain, stiffness, decreased range of motion, back pain. Neuro-     nothing unusual Psych:  change in mood or affect.  depression or anxiety.   memory loss.    Objective:   OBJ- Physical Exam General- Alert, Oriented, Affect-appropriate, Distress- none acute Skin- rash-none, lesions- none, excoriation- none Lymphadenopathy- none Head- atraumatic  Eyes- Gross vision intact, PERRLA, conjunctivae and secretions clear            Ears- Hearing, canals-normal            Nose- Clear, no-Septal dev, mucus, polyps, erosion, perforation             Throat- Mallampati II-III , mucosa clear , drainage- none, tonsils- atrophic Neck- flexible , trachea midline, no stridor , thyroid nl, carotid no bruit Chest - symmetrical excursion , unlabored           Heart/CV- RRR , no murmur , no gallop  , no rub, nl s1 s2                           - JVD- none , edema- none, stasis changes- none, varices- none           Lung- clear to P&A, wheeze- none, cough- none , dullness-none, rub- none           Chest wall-  Abd-  Br/ Gen/ Rectal- Not done, not indicated Extrem-  cyanosis- none, clubbing, none, atrophy- none, strength- nl Neuro- grossly intact to observation    Assessment & Plan:

## 2015-11-18 NOTE — Assessment & Plan Note (Signed)
Treat this as mild exacerbation Plan Depo-Medrol with discussion of impact on diabetes, which she accepts. Refill meds with discussion.

## 2015-12-19 ENCOUNTER — Other Ambulatory Visit: Payer: Self-pay | Admitting: Adult Health

## 2015-12-23 ENCOUNTER — Telehealth: Payer: Self-pay | Admitting: Adult Health

## 2015-12-23 MED ORDER — ALBUTEROL SULFATE HFA 108 (90 BASE) MCG/ACT IN AERS: INHALATION_SPRAY | RESPIRATORY_TRACT | 6 refills | Status: DC

## 2015-12-23 NOTE — Telephone Encounter (Signed)
Called and spoke to pt. Pt is requesting Proair refill, rx sent to preferred pharmacy. Pt verbalized understanding and denied any further questions or concerns at this time.

## 2016-01-18 ENCOUNTER — Other Ambulatory Visit: Payer: Self-pay | Admitting: Adult Health

## 2016-02-20 ENCOUNTER — Telehealth: Payer: Self-pay | Admitting: Internal Medicine

## 2016-02-20 MED ORDER — PREDNISONE 10 MG PO TABS
ORAL_TABLET | ORAL | 0 refills | Status: DC
Start: 2016-02-20 — End: 2016-06-04

## 2016-02-20 NOTE — Telephone Encounter (Signed)
Spoke with pt. States that is having issues with shortness of breath. Reports that she is having to use albuterol every hour. SOB and wheezing are present. While speaking to the pt on the phone she was not in distress. She is still currently taking Dulera 200mg  2 puffs BID. Pt would like CY's recommendations. CY please advise. Thanks.  Allergies  Allergen Reactions  . Latex     Rash    Current Outpatient Prescriptions on File Prior to Visit  Medication Sig Dispense Refill  . albuterol (PROAIR HFA) 108 (90 Base) MCG/ACT inhaler inhale 2 puffs by mouth every 6 hours if needed 8.5 Inhaler 6  . leflunomide (ARAVA) 20 MG tablet Take 20 mg by mouth daily.  0  . mometasone-formoterol (DULERA) 200-5 MCG/ACT AERO Inhale 2 puffs into the lungs 2 (two) times daily. 13 g 6  . montelukast (SINGULAIR) 10 MG tablet take 1 tablet by mouth at bedtime 30 tablet 5  . NIFEdipine (PROCARDIA XL/ADALAT-CC) 90 MG 24 hr tablet Take 90 mg by mouth daily.  0  . potassium chloride (K-DUR) 10 MEQ tablet Take 10 mEq by mouth daily.  0  . predniSONE (DELTASONE) 10 MG tablet 4 tabs for 2 days, then 3 tabs for 2 days, 2 tabs for 2 days, then 1 tab for 2 days, then stop 20 tablet 0  . valsartan (DIOVAN) 320 MG tablet Take 320 mg by mouth.  0   No current facility-administered medications on file prior to visit.

## 2016-02-20 NOTE — Telephone Encounter (Signed)
Spoke with pt. She is aware of CY's recommendation. Rx has been sent in. Nothing further was needed.  

## 2016-02-20 NOTE — Telephone Encounter (Signed)
Offer prednisone 10 mg, # 20, 4 X 2 DAYS, 3 X 2 DAYS, 2 X 2 DAYS, 1 X 2 DAYS  

## 2016-06-03 ENCOUNTER — Telehealth: Payer: Self-pay | Admitting: Internal Medicine

## 2016-06-03 NOTE — Telephone Encounter (Signed)
Spoke with pt. States that her breathing is getting worse. Pt only wants to see CY. CY had a cancellation for 06/04/16 at 11am. Pt has been scheduled at this date and time. Advised her that if her breathing is gets worse between now and then she should seek emergency care. She agreed and verbalized understanding. Nothing further was needed.

## 2016-06-04 ENCOUNTER — Ambulatory Visit (INDEPENDENT_AMBULATORY_CARE_PROVIDER_SITE_OTHER): Payer: Medicare Other | Admitting: Internal Medicine

## 2016-06-04 ENCOUNTER — Encounter: Payer: Self-pay | Admitting: Internal Medicine

## 2016-06-04 VITALS — BP 118/74 | HR 94 | Ht 66.5 in | Wt 160.6 lb

## 2016-06-04 DIAGNOSIS — IMO0001 Reserved for inherently not codable concepts without codable children: Secondary | ICD-10-CM

## 2016-06-04 DIAGNOSIS — J4521 Mild intermittent asthma with (acute) exacerbation: Secondary | ICD-10-CM

## 2016-06-04 DIAGNOSIS — M069 Rheumatoid arthritis, unspecified: Secondary | ICD-10-CM | POA: Insufficient documentation

## 2016-06-04 DIAGNOSIS — J45901 Unspecified asthma with (acute) exacerbation: Secondary | ICD-10-CM | POA: Diagnosis not present

## 2016-06-04 LAB — NITRIC OXIDE: Other: 50

## 2016-06-04 MED ORDER — ALBUTEROL SULFATE (2.5 MG/3ML) 0.083% IN NEBU: INHALATION_SOLUTION | RESPIRATORY_TRACT | 12 refills | Status: DC

## 2016-06-04 MED ORDER — METHYLPREDNISOLONE ACETATE 80 MG/ML IJ SUSP
80.0000 mg | Freq: Once | INTRAMUSCULAR | Status: AC
  Administered 2016-06-04: 80 mg via INTRAMUSCULAR

## 2016-06-04 MED ORDER — COMPRESSOR NEBULIZER MISC: 1.0000 | Freq: Once | 0 refills | Status: AC

## 2016-06-04 MED ORDER — BUDESONIDE-FORMOTEROL FUMARATE 160-4.5 MCG/ACT IN AERO
INHALATION_SPRAY | RESPIRATORY_TRACT | 12 refills | Status: DC
Start: 2016-06-04 — End: 2018-03-30

## 2016-06-04 MED ORDER — BUDESONIDE-FORMOTEROL FUMARATE 160-4.5 MCG/ACT IN AERO: 2.0000 | INHALATION_SPRAY | Freq: Two times a day (BID) | RESPIRATORY_TRACT | 0 refills | Status: DC

## 2016-06-04 MED ORDER — LEVALBUTEROL HCL 0.63 MG/3ML IN NEBU
0.6300 mg | INHALATION_SOLUTION | Freq: Once | RESPIRATORY_TRACT | Status: AC
  Administered 2016-06-04: 0.63 mg via RESPIRATORY_TRACT

## 2016-06-04 NOTE — Assessment & Plan Note (Signed)
On [place] managed  By Rheumatology. Has not had Rheumatoid lung involvement.

## 2016-06-04 NOTE — Progress Notes (Signed)
Subjective:    Patient ID: Heather Austin, female    DOB: 10/10/1945, 71 y.o.   MRN: [id number]  HPI  female never smoker followed for asthma Allergy Profile 04/04/2012 -total IgE 439.4 with elevations for most specific allergens tested on the panel. Office Spirometry- 08/01/14 moderate obstructive airways disease. FVC 2.12/79%, FEV1 1.12/55%, FEV1/FVC 0.53, FEF 25-75 percent 0.47/23%. FENO 06/04/16- 50 H Office spirometry 06/04/16- severe obstructive airways disease-FVC 1.62/62%, FEV1 0.99/49%, ratio 0.6 -------------------------------------------------------------------------------------------  11/17/2015-71 year old female never smoker followed for asthma FOLLOWS FOR: Pt last seen by TP 06-19-15; Pt states she stays indoors mostly due to heat and continues to have SOB with activity. CXR 06/19/2015-IMPRESSION: There is no active cardiopulmonary disease.  06/04/2816-71 year old female never smoker followed for asthma, moderate intermittent, Complicated by RA, HBP  On Arava for Rheumatoid arthritis  Says she had flu shot in the fall. Since the fall she has noted gradually worsening shortness of breath at rest and with exertion. Much wheezing. Little productive cough. No fever. Wakes often at night to use pro-air and is using rescue inhaler for 5 times daily. Says Dulera 2 puffs twice a day not as helpful. Has refillable prednisone taper last used 2 months ago-does help but after a week or 2 she will be wheezing again. Denies nasal symptoms, rash, adenopathy, history of heart disease.. FENO 06/04/16- 50 H Office spirometry 06/04/16- severe obstructive airways disease-FVC 1.62/62%, FEV1 0.99/49%, ratio 0.61  ROS-see HPI   Negative unless "+" Constitutional:    weight loss, night sweats, fevers, chills, fatigue, lassitude. HEENT:    headaches, difficulty swallowing, +tooth/dental problems, sore throat,       sneezing, itching, ear ache, nasal congestion, post nasal drip, snoring CV:    chest pain,  orthopnea, PND, swelling in lower extremities, anasarca,                                                     dizziness, palpitations Resp:   +shortness of breath with exertion or at rest.                productive cough,   non-productive cough, coughing up of blood.              change in color of mucus.  + wheezing.   Skin:    rash or lesions. GI:  No-   heartburn, indigestion, abdominal pain, nausea, vomiting, diarrhea,                 change in bowel habits, loss of appetite GU: dysuria, change in color of urine, no urgency or frequency.   flank pain. MS:   joint pain, stiffness, decreased range of motion, back pain. Neuro-     nothing unusual Psych:  change in mood or affect.  depression or anxiety.   memory loss.    Objective:   OBJ- Physical Exam General- Alert, Oriented, Affect-appropriate, Distress- none acute Skin- rash-none, lesions- none, excoriation- none Lymphadenopathy- none Head- atraumatic            Eyes- Gross vision intact, PERRLA, conjunctivae and secretions clear            Ears- Hearing, canals-normal            Nose- Clear, no-Septal dev, mucus, polyps, erosion, perforation  Throat- Mallampati II-III , mucosa clear , drainage- none, tonsils- atrophic Neck- flexible , trachea midline, no stridor , thyroid nl, carotid no bruit Chest - symmetrical excursion , unlabored           Heart/CV- RRR , no murmur , no gallop  , no rub, nl s1 s2                           - JVD- none , edema- none, stasis changes- none, varices- none           Lung-  wheeze+ Bilateral/not labored, cough- none , dullness-none, rub- none           Chest wall-  Abd-  Br/ Gen/ Rectal- Not done, not indicated Extrem- cyanosis- none, clubbing, none, atrophy- none, strength- nl Neuro- grossly intact to observation    Assessment & Plan:

## 2016-06-04 NOTE — Assessment & Plan Note (Signed)
Elevated FENO suggests an allergic component. This exacerbation has been hanging on for a couple of months, to long to blame a single viral episode. Education on environment remediation done. Not clear why Dulera not helpful. We can try changing to Symbicort to be similar. I do think inhaled steroid component is useful. Plan-try changing Dulera to Symbicort with emphasis on regular compliant use. Nebulizer treatments Xopenex and Depo-Medrol today. Home nebulizer machine and albuterol to be obtained through a DME company. Educated on Financial risk analyst including dust mite and casings, room air purifier HEPA

## 2016-06-04 NOTE — Progress Notes (Signed)
Patient seen in the office today and instructed on use of Symbicort 160.  Patient expressed understanding and demonstrated technique. Benetta Spar [facility] 06/04/2016

## 2016-06-04 NOTE — Patient Instructions (Addendum)
Order- FENO    Dx asthma exacerbation  Order- Office spirometry  Neb xop 0.63  Depo 800  Scripts were printed for nebulizer machine and albuterol neb solution to get from DME-- per [facility]  Sample and printed script for Symbicort    Inhale 2 puffs, then rinse mouth, twice daily   Try this instead of [facility]

## 2016-06-08 ENCOUNTER — Telehealth: Payer: Self-pay | Admitting: Internal Medicine

## 2016-06-08 DIAGNOSIS — IMO0001 Reserved for inherently not codable concepts without codable children: Secondary | ICD-10-CM

## 2016-06-08 DIAGNOSIS — J4520 Mild intermittent asthma, uncomplicated: Secondary | ICD-10-CM

## 2016-06-08 NOTE — Telephone Encounter (Signed)
Spoke with pt. She is needing to be set up with a nebulizer machine. Order has been placed. Nothing further was needed.

## 2016-10-01 ENCOUNTER — Ambulatory Visit: Payer: Medicare Other | Admitting: Internal Medicine

## 2016-10-04 ENCOUNTER — Other Ambulatory Visit: Payer: Self-pay | Admitting: Adult Health

## 2016-11-19 ENCOUNTER — Other Ambulatory Visit: Payer: Self-pay | Admitting: Internal Medicine

## 2017-01-11 ENCOUNTER — Other Ambulatory Visit: Payer: Self-pay | Admitting: Internal Medicine

## 2017-01-20 ENCOUNTER — Ambulatory Visit: Payer: Medicare Other | Admitting: Internal Medicine

## 2017-03-12 ENCOUNTER — Other Ambulatory Visit: Payer: Self-pay | Admitting: Internal Medicine

## 2017-03-30 ENCOUNTER — Ambulatory Visit (INDEPENDENT_AMBULATORY_CARE_PROVIDER_SITE_OTHER)
Admission: RE | Admit: 2017-03-30 | Discharge: 2017-03-30 | Disposition: A | Discharge status: Home / Self Care | Payer: Medicare Other | Source: Ambulatory Visit | Attending: Internal Medicine | Admitting: Internal Medicine

## 2017-03-30 ENCOUNTER — Encounter: Payer: Self-pay | Admitting: Internal Medicine

## 2017-03-30 ENCOUNTER — Ambulatory Visit: Payer: Medicare Other | Admitting: Internal Medicine

## 2017-03-30 VITALS — BP 132/82 | HR 62 | Ht 66.0 in | Wt 153.4 lb

## 2017-03-30 DIAGNOSIS — M459 Ankylosing spondylitis of unspecified sites in spine: Secondary | ICD-10-CM | POA: Diagnosis not present

## 2017-03-30 DIAGNOSIS — J449 Chronic obstructive pulmonary disease, unspecified: Secondary | ICD-10-CM

## 2017-03-30 DIAGNOSIS — J4520 Mild intermittent asthma, uncomplicated: Secondary | ICD-10-CM

## 2017-03-30 DIAGNOSIS — IMO0001 Reserved for inherently not codable concepts without codable children: Secondary | ICD-10-CM

## 2017-03-30 IMAGING — DX DG CHEST 2V
2 series · 2 of 2 positions shown · non-contrast
Comparison: Chest x-ray of [DATE]

CLINICAL DATA: History of asthma.  No current complaints.

EXAM:
CHEST  2 VIEW

[chest pa]
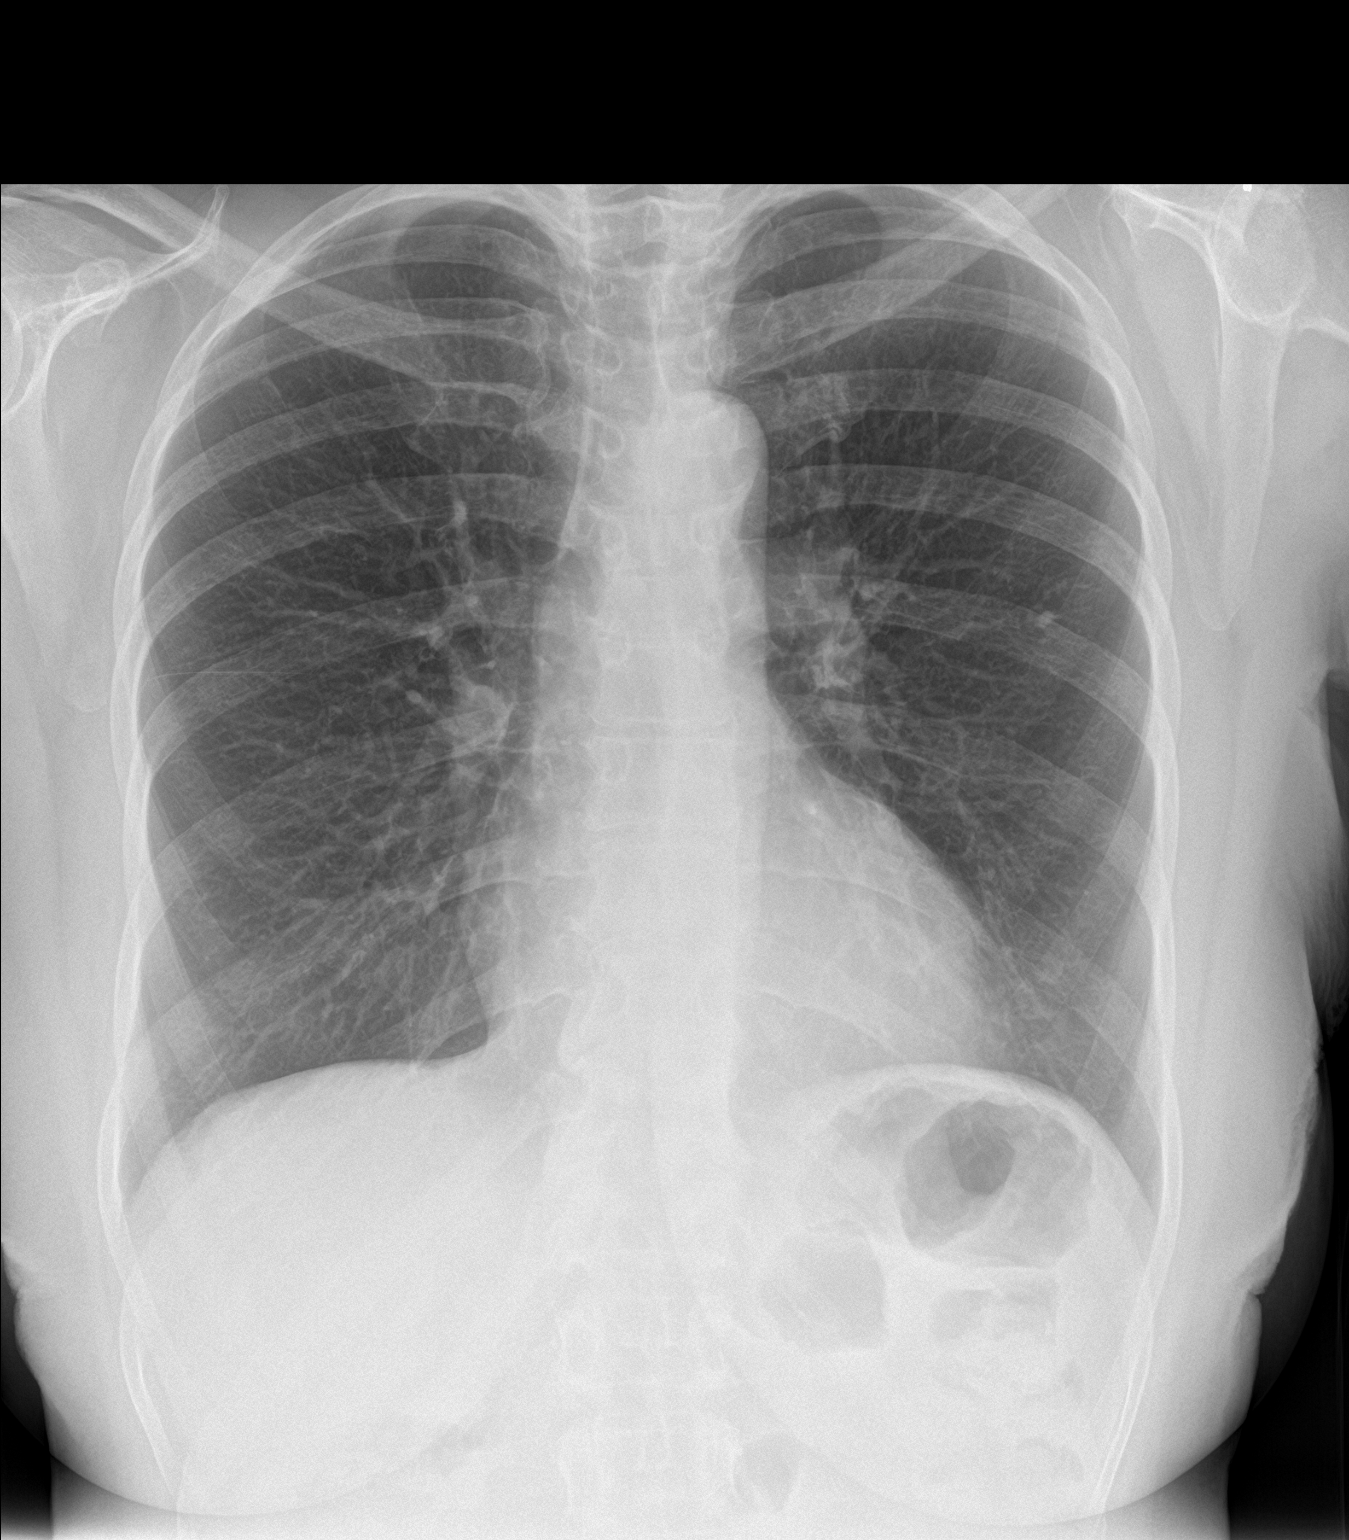

[chest lat]
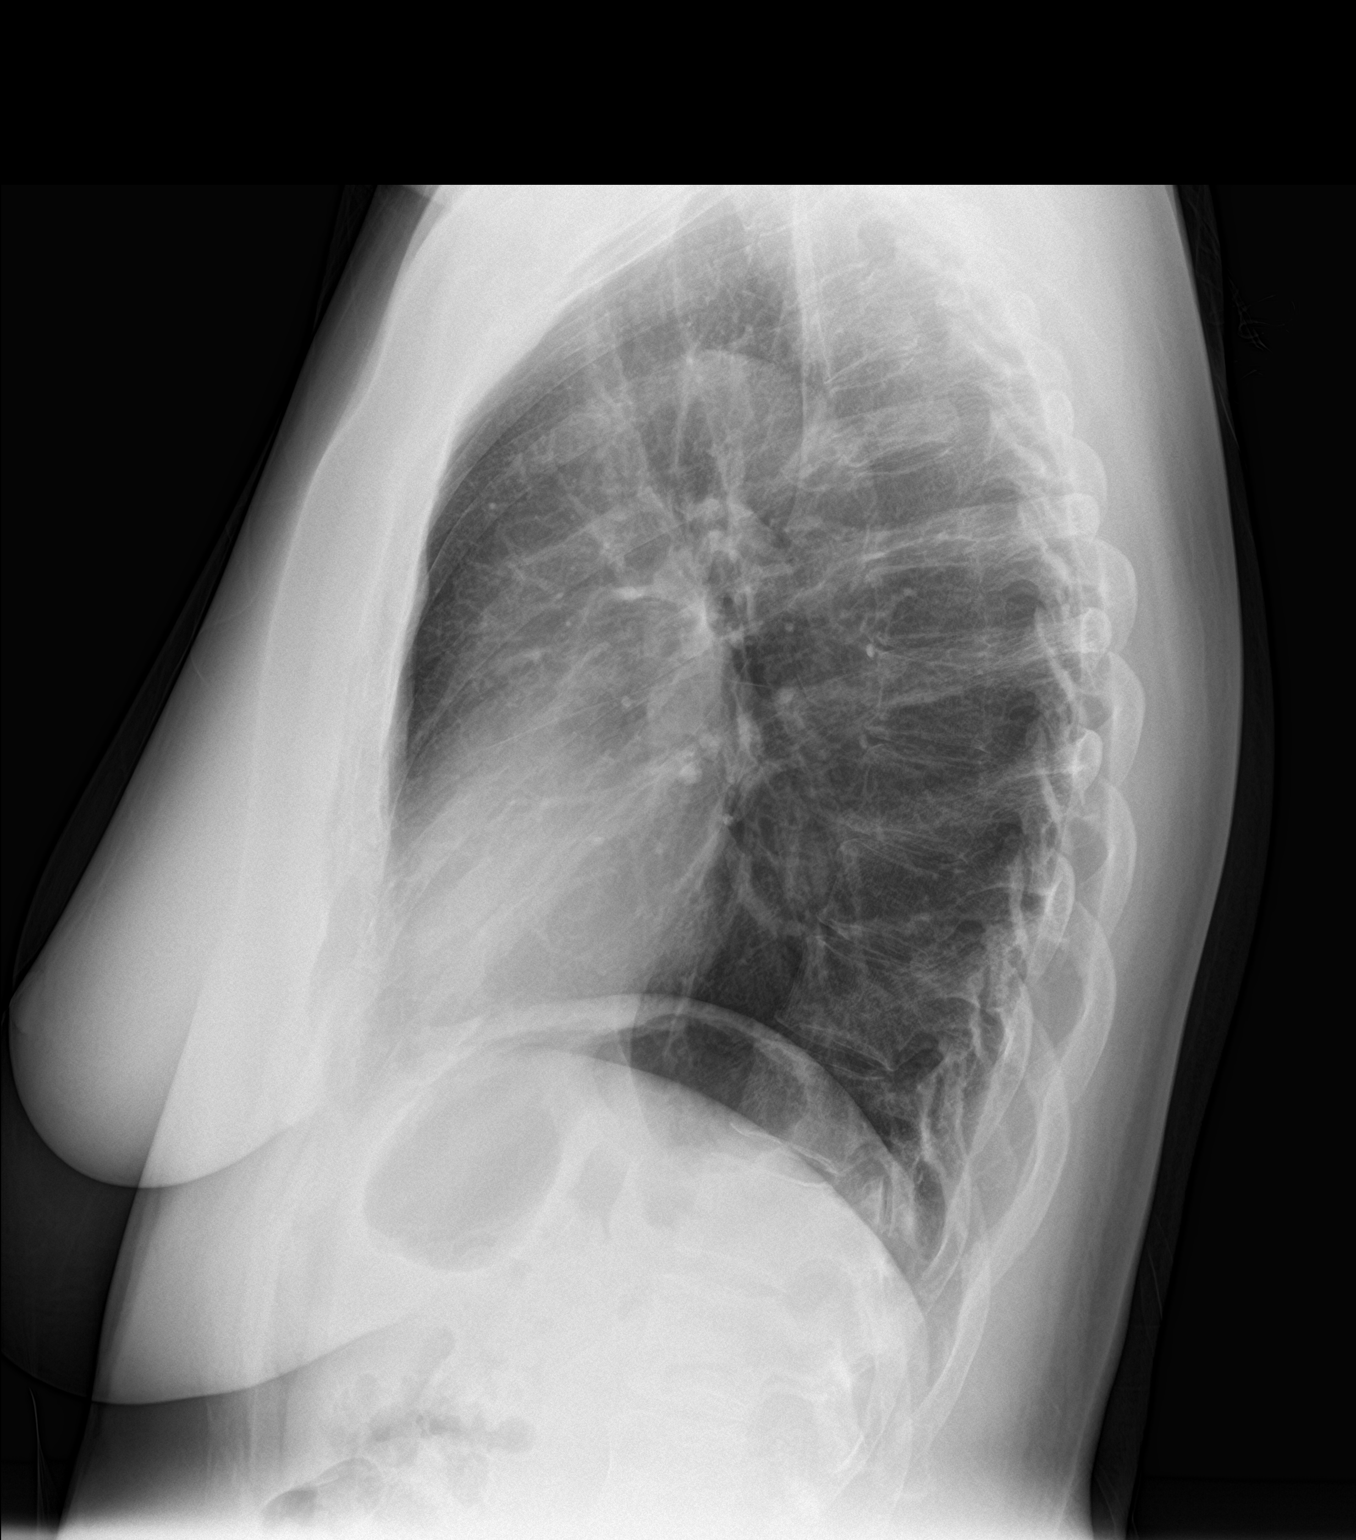

[2 of 2 positions shown; findings below may reference images not displayed]

FINDINGS: The lungs are well-expanded. There is no focal infiltrate. There is
a stable calcified approximately 2 x 4 mm nodule in the left mid
lung. The heart and pulmonary vascularity are normal. The
mediastinum is normal in width. There is calcification in the wall
of the aortic arch. There is no pleural effusion. The bony thorax is
unremarkable.
IMPRESSION: There is no active cardiopulmonary disease. Previous granulomatous
infection.

Thoracic aortic atherosclerosis.

## 2017-03-30 NOTE — Assessment & Plan Note (Signed)
She rarely notes active wheeze but PFTs routinely show moderately severe obstructive disease.  Never smoker so this may be COPD derived from asthma, rather than reversible airway obstruction.  Not clear Singulair doing anything. Plan-remove Dulera from medication list since that is a duplication that she is using Symbicort.  DC Singulair for observation.  CXR.

## 2017-03-30 NOTE — Patient Instructions (Addendum)
Ok to stop the montelukast(Singulair) pill and see how you do without it.  It would be ok to use up what you have, and then just don't buy any more.  I have taken the blue Dulera inhaler of your medication list. You don't seem to have been using it, and the red Symbicort inhaler will work just fine- inhale 2 puffs, then rinse your mouth, twice daily.   Ok to continue using the red Proair rescue inhaler and nebulizer machine, just as needed.  Please call if you need refills or our help.  Order- CXR    Dx COPD mixed type

## 2017-03-30 NOTE — Progress Notes (Signed)
Subjective:    Patient ID: Heather Austin, female    DOB: [birth year], 71 y.o.   MRN: [id number]  HPI  female never smoker followed for asthma Allergy Profile 04/04/2012 -total IgE 439.4 with elevations for most specific allergens tested on the panel. Office Spirometry- 08/01/14 moderate obstructive airways disease. FVC 2.12/79%, FEV1 1.12/55%, FEV1/FVC 0.53, FEF 25-75 percent 0.47/23%. FENO 06/04/16- 50 H Office spirometry 06/04/16- severe obstructive airways disease-FVC 1.62/62%, FEV1 0.99/49%, ratio 0.6 -------------------------------------------------------------------------------------------  06/04/2816-71 year old female never smoker followed for asthma, moderate intermittent, Complicated by RA, HBP  On Arava for Rheumatoid arthritis  Says she had flu shot in the fall. Since the fall she has noted gradually worsening shortness of breath at rest and with exertion. Much wheezing. Little productive cough. No fever. Wakes often at night to use pro-air and is using rescue inhaler for 5 times daily. Says Dulera 2 puffs twice a day not as helpful. Has refillable prednisone taper last used 2 months ago-does help but after a week or 2 she will be wheezing again. Denies nasal symptoms, rash, adenopathy, history of heart disease.. FENO 06/04/16- 50 H Office spirometry 06/04/16- severe obstructive airways disease-FVC 1.62/62%, FEV1 0.99/49%, ratio 0.61  03/30/17- 71 year old female never smoker followed for asthma, moderate intermittent, Complicated by RA, HBP  ----Pt states that she has been doing good since last visit. Denies any complaints of SOB, cough, or CP. States that she is having cataract surgery 04/28/17 Denies any exacerbations since last here.  Rare use of rescue inhaler.  Using Symbicort-does not have Dulera.  Not clear that Singulair does anything so we can take it off her list.  Little cough no sputum, no chest pain. Pattern is of chronic fixed obstruction-probably derived from asthma and this never  smoker.  ROS-see HPI   + = positive Constitutional:    weight loss, night sweats, fevers, chills, fatigue, lassitude. HEENT:    headaches, difficulty swallowing, +tooth/dental problems, sore throat,       sneezing, itching, ear ache, nasal congestion, post nasal drip, snoring CV:    chest pain, orthopnea, PND, swelling in lower extremities, anasarca,                                                     dizziness, palpitations Resp:   +shortness of breath with exertion or at rest.                productive cough,   non-productive cough, coughing up of blood.              change in color of mucus.   wheezing.   Skin:    rash or lesions. GI:  No-   heartburn, indigestion, abdominal pain, nausea, vomiting, diarrhea,                 change in bowel habits, loss of appetite GU: dysuria, change in color of urine, no urgency or frequency.   flank pain. MS:   joint pain, stiffness, decreased range of motion, back pain. Neuro-     nothing unusual Psych:  change in mood or affect.  depression or anxiety.   memory loss.    Objective:   OBJ- Physical Exam General- Alert, Oriented, Affect-appropriate, Distress- none acute Skin- rash-none, lesions- none, excoriation- none Lymphadenopathy- none Head- atraumatic  Eyes- Gross vision intact, PERRLA, conjunctivae and secretions clear            Ears- Hearing, canals-normal            Nose- Clear, no-Septal dev, mucus, polyps, erosion, perforation             Throat- Mallampati II-III , mucosa clear , drainage- none, tonsils- atrophic Neck- flexible , trachea midline, no stridor , thyroid nl, carotid no bruit Chest - symmetrical excursion , unlabored           Heart/CV- RRR , no murmur , no gallop  , no rub, nl s1 s2                           - JVD- none , edema- none, stasis changes- none, varices- none           Lung-  wheeze+ Bilateral/not labored, cough- none , dullness-none, rub- none           Chest wall-  Abd-  Br/ Gen/ Rectal- Not  done, not indicated Extrem- cyanosis- none, clubbing, none, atrophy- none, strength- nl Neuro- grossly intact to observation    Assessment & Plan:

## 2017-03-30 NOTE — Assessment & Plan Note (Signed)
I do not hear crackles.   Plan-update CXR

## 2017-06-10 ENCOUNTER — Telehealth (HOSPITAL_COMMUNITY): Payer: Self-pay | Admitting: Family Medicine

## 2017-06-14 NOTE — Telephone Encounter (Signed)
User: Cherie Dark A Date/time: 06/14/17 12:07 PM  Comment: Called pt's dtr and lmsg for her to CB to get pt scheduled for an echo.   Context:  Outcome: Left Message  Phone number: [phone] Phone Type:   Comm. type: Telephone Call type: Outgoing  Contact: Greg,Kristina Relation to patient: Emergency Contact    User: Cherie Dark A Date/time: 06/13/17 10:16 AM  Comment: Called pt and lmsg for dtr to CB to get patient sch for echo.  Context:  Outcome: Left Message  Phone number: [phone] Phone Type: Mobile  Comm. type: Telephone Call type: Outgoing  Contact: Greg,Kristina Relation to patient: Emergency Contact    User: Cherie Dark A Date/time: 06/10/17 9:36 AM  Comment: Called dtr and lmsg for her to CB to get mother sch for echo.Vassie Moment  Context:  Outcome: Left Message  Phone number: [phone] Phone Type: Mobile  Comm. type: Telephone Call type: Outgoing  Contact: Greg,Kristina Relation to patient: Emergency Contact

## 2017-07-06 ENCOUNTER — Other Ambulatory Visit: Payer: Self-pay | Admitting: Family Medicine

## 2017-07-06 ENCOUNTER — Other Ambulatory Visit: Payer: Self-pay

## 2017-07-06 ENCOUNTER — Ambulatory Visit (HOSPITAL_COMMUNITY): Payer: Medicare Other | Attending: Cardiovascular Disease

## 2017-07-06 DIAGNOSIS — R0789 Other chest pain: Secondary | ICD-10-CM | POA: Insufficient documentation

## 2017-07-06 DIAGNOSIS — I10 Essential (primary) hypertension: Secondary | ICD-10-CM | POA: Diagnosis present

## 2017-07-06 DIAGNOSIS — I340 Nonrheumatic mitral (valve) insufficiency: Secondary | ICD-10-CM | POA: Insufficient documentation

## 2017-07-06 DIAGNOSIS — I371 Nonrheumatic pulmonary valve insufficiency: Secondary | ICD-10-CM | POA: Diagnosis not present

## 2017-07-06 DIAGNOSIS — I517 Cardiomegaly: Secondary | ICD-10-CM | POA: Diagnosis not present

## 2018-02-14 DIAGNOSIS — I10 Essential (primary) hypertension: Secondary | ICD-10-CM | POA: Diagnosis not present

## 2018-02-14 DIAGNOSIS — Z23 Encounter for immunization: Secondary | ICD-10-CM | POA: Diagnosis not present

## 2018-02-21 DIAGNOSIS — I10 Essential (primary) hypertension: Secondary | ICD-10-CM | POA: Diagnosis not present

## 2018-03-07 DIAGNOSIS — D649 Anemia, unspecified: Secondary | ICD-10-CM | POA: Diagnosis not present

## 2018-03-07 DIAGNOSIS — N183 Chronic kidney disease, stage 3 (moderate): Secondary | ICD-10-CM | POA: Diagnosis not present

## 2018-03-07 DIAGNOSIS — I10 Essential (primary) hypertension: Secondary | ICD-10-CM | POA: Diagnosis not present

## 2018-03-07 DIAGNOSIS — E785 Hyperlipidemia, unspecified: Secondary | ICD-10-CM | POA: Diagnosis not present

## 2018-03-07 DIAGNOSIS — E559 Vitamin D deficiency, unspecified: Secondary | ICD-10-CM | POA: Diagnosis not present

## 2018-03-07 DIAGNOSIS — J4520 Mild intermittent asthma, uncomplicated: Secondary | ICD-10-CM | POA: Diagnosis not present

## 2018-03-09 DIAGNOSIS — E785 Hyperlipidemia, unspecified: Secondary | ICD-10-CM | POA: Diagnosis not present

## 2018-03-09 DIAGNOSIS — Z79899 Other long term (current) drug therapy: Secondary | ICD-10-CM | POA: Diagnosis not present

## 2018-03-09 DIAGNOSIS — M8580 Other specified disorders of bone density and structure, unspecified site: Secondary | ICD-10-CM | POA: Diagnosis not present

## 2018-03-09 DIAGNOSIS — M0609 Rheumatoid arthritis without rheumatoid factor, multiple sites: Secondary | ICD-10-CM | POA: Diagnosis not present

## 2018-03-30 ENCOUNTER — Ambulatory Visit (INDEPENDENT_AMBULATORY_CARE_PROVIDER_SITE_OTHER): Payer: Medicare Other | Admitting: Internal Medicine

## 2018-03-30 ENCOUNTER — Encounter: Payer: Self-pay | Admitting: Internal Medicine

## 2018-03-30 DIAGNOSIS — J449 Chronic obstructive pulmonary disease, unspecified: Secondary | ICD-10-CM

## 2018-03-30 DIAGNOSIS — R0609 Other forms of dyspnea: Secondary | ICD-10-CM

## 2018-03-30 DIAGNOSIS — R0600 Dyspnea, unspecified: Secondary | ICD-10-CM

## 2018-03-30 MED ORDER — ALBUTEROL SULFATE (2.5 MG/3ML) 0.083% IN NEBU: INHALATION_SOLUTION | RESPIRATORY_TRACT | 12 refills | Status: DC

## 2018-03-30 MED ORDER — ALBUTEROL SULFATE HFA 108 (90 BASE) MCG/ACT IN AERS: INHALATION_SPRAY | RESPIRATORY_TRACT | 6 refills | Status: DC

## 2018-03-30 MED ORDER — BUDESONIDE-FORMOTEROL FUMARATE 160-4.5 MCG/ACT IN AERO: INHALATION_SPRAY | RESPIRATORY_TRACT | 12 refills | Status: DC

## 2018-03-30 NOTE — Progress Notes (Signed)
Subjective:    Patient ID: Heather Austin, female    DOB: 19-Sep-1945, 72 y.o.   MRN: [id number]  HPI  female never smoker followed for asthma/ COPD, Complicated by RA, HBP  Allergy Profile 04/04/2012 -total IgE 439.4 with elevations for most specific allergens tested on the panel. Office Spirometry- 08/01/14 moderate obstructive airways disease. FVC 2.12/79%, FEV1 1.12/55%, FEV1/FVC 0.53, FEF 25-75 percent 0.47/23%. FENO 06/04/16- 50 H Office spirometry 06/04/16- severe obstructive airways disease-FVC 1.62/62%, FEV1 0.99/49%, ratio 0.6 -------------------------------------------------------------------------------------------  03/30/17- 72 year old female never smoker followed for asthma, moderate intermittent, Complicated by RA, HBP  ----Pt states that she has been doing good since last visit. Denies any complaints of SOB, cough, or CP. States that she is having cataract surgery 04/28/17 Denies any exacerbations since last here.  Rare use of rescue inhaler.  Using Symbicort-does not have Dulera.  Not clear that Singulair does anything so we can take it off her list.  Little cough no sputum, no chest pain. Pattern is of chronic fixed obstruction-probably derived from asthma in this never smoker.  03/30/2018- 72 year old female never smoker followed for asthma/ COPD, moderate intermittent, Complicated by RA, HBP  COPD mixed type: Pt states she has issues with SOB with exertion and depends on weather-if too hot or too cold gets SOB.  Neb alb, proair hfa, Symbicot 160,  Dyspnea on exertion with moderate activity no acute events.  She uses her nebulizer machine several times a week and occasionally uses rescue inhaler while continuing Symbicort.  Cough, no sputum, no routine wheeze.  Plans to start going to a gym which I encouraged.  Had successful cataract surgery. CXR 03/30/2017 IMPRESSION: There is no active cardiopulmonary disease. Previous granulomatous infection. Thoracic aortic  atherosclerosis.  ROS-see HPI   + = positive Constitutional:    weight loss, night sweats, fevers, chills, fatigue, lassitude. HEENT:    headaches, difficulty swallowing, +tooth/dental problems, sore throat,       sneezing, itching, ear ache, nasal congestion, post nasal drip, snoring CV:    chest pain, orthopnea, PND, swelling in lower extremities, anasarca,                                                     dizziness, palpitations Resp:   +shortness of breath with exertion or at rest.                productive cough,   non-productive cough, coughing up of blood.              change in color of mucus.   wheezing.   Skin:    rash or lesions. GI:  No-   heartburn, indigestion, abdominal pain, nausea, vomiting, diarrhea,                 change in bowel habits, loss of appetite GU: dysuria, change in color of urine, no urgency or frequency.   flank pain. MS:   joint pain, stiffness, decreased range of motion, back pain. Neuro-     nothing unusual Psych:  change in mood or affect.  depression or anxiety.   memory loss.    Objective:   OBJ- Physical Exam General- Alert, Oriented, Affect-appropriate, Distress- none acute Skin- rash-none, lesions- none, excoriation- none Lymphadenopathy- none Head- atraumatic            Eyes- Gross  vision intact, PERRLA, conjunctivae and secretions clear            Ears- Hearing, canals-normal            Nose- Clear, no-Septal dev, mucus, polyps, erosion, perforation             Throat- Mallampati II-III , mucosa clear , drainage- none, tonsils- atrophic Neck- flexible , trachea midline, no stridor , thyroid nl, carotid no bruit Chest - symmetrical excursion , unlabored           Heart/CV- RRR , no murmur , no gallop  , no rub, nl s1 s2                           - JVD- none , edema- none, stasis changes- none, varices- none           Lung-  wheeze+ Bilateral/not labored, cough- none , dullness-none, rub- none           Chest wall-  Abd-  Br/ Gen/  Rectal- Not done, not indicated Extrem- cyanosis- none, clubbing, none, atrophy- none, strength- nl Neuro- grossly intact to observation    Assessment & Plan:

## 2018-03-30 NOTE — Assessment & Plan Note (Signed)
Sensitive to temperature changes but otherwise stable across the year with little active bronchitis.  Current meds seem appropriate. Plan-medications discussed and refilled.

## 2018-03-30 NOTE — Assessment & Plan Note (Signed)
I encouraged her decision to start exercising regularly at the gym.  This is the best way to improve overall stamina as discussed.

## 2018-03-30 NOTE — Patient Instructions (Signed)
Med refills sent  I agree- over time, regular exercise at the gym can help build your stamina  Please call if we can help

## 2018-05-08 ENCOUNTER — Emergency Department (HOSPITAL_COMMUNITY): Payer: Medicare Other

## 2018-05-08 ENCOUNTER — Emergency Department (HOSPITAL_COMMUNITY)
Admission: EM | Admit: 2018-05-08 | Discharge: 2018-05-08 | Disposition: A | Discharge status: Home / Self Care | Payer: Medicare Other | Attending: Emergency Medicine | Admitting: Emergency Medicine

## 2018-05-08 ENCOUNTER — Other Ambulatory Visit: Payer: Self-pay

## 2018-05-08 ENCOUNTER — Encounter: Payer: Self-pay | Admitting: Emergency Medicine

## 2018-05-08 DIAGNOSIS — Z91040 Latex allergy status: Secondary | ICD-10-CM | POA: Diagnosis not present

## 2018-05-08 DIAGNOSIS — I10 Essential (primary) hypertension: Secondary | ICD-10-CM | POA: Insufficient documentation

## 2018-05-08 DIAGNOSIS — Z79899 Other long term (current) drug therapy: Secondary | ICD-10-CM | POA: Insufficient documentation

## 2018-05-08 DIAGNOSIS — J45909 Unspecified asthma, uncomplicated: Secondary | ICD-10-CM | POA: Diagnosis not present

## 2018-05-08 DIAGNOSIS — R42 Dizziness and giddiness: Secondary | ICD-10-CM | POA: Diagnosis not present

## 2018-05-08 DIAGNOSIS — R911 Solitary pulmonary nodule: Secondary | ICD-10-CM | POA: Diagnosis not present

## 2018-05-08 DIAGNOSIS — R0902 Hypoxemia: Secondary | ICD-10-CM | POA: Diagnosis not present

## 2018-05-08 DIAGNOSIS — R011 Cardiac murmur, unspecified: Secondary | ICD-10-CM | POA: Diagnosis not present

## 2018-05-08 LAB — CBC
HCT: 40.1 % (ref 36.0–46.0)
Hemoglobin: 12.5 g/dL (ref 12.0–15.0)
MCH: 28.9 pg (ref 26.0–34.0)
MCHC: 31.2 g/dL (ref 30.0–36.0)
MCV: 92.6 fL (ref 80.0–100.0)
Platelets: 198 10*3/uL (ref 150–400)
RBC: 4.33 MIL/uL (ref 3.87–5.11)
RDW: 12.8 % (ref 11.5–15.5)
WBC: 6.5 10*3/uL (ref 4.0–10.5)
nRBC: 0 % (ref 0.0–0.2)

## 2018-05-08 LAB — I-STAT TROPONIN, ED: Troponin i, poc: 0.01 ng/mL (ref 0.00–0.08)

## 2018-05-08 LAB — BASIC METABOLIC PANEL
Anion gap: 11 (ref 5–15)
BUN: 31 mg/dL — ABNORMAL HIGH (ref 8–23)
CO2: 25 mmol/L (ref 22–32)
Calcium: 10 mg/dL (ref 8.9–10.3)
Chloride: 106 mmol/L (ref 98–111)
Creatinine, Ser: 1.85 mg/dL — ABNORMAL HIGH (ref 0.44–1.00)
GFR calc Af Amer: 31 mL/min — ABNORMAL LOW (ref >60)
GFR calc non Af Amer: 26 mL/min — ABNORMAL LOW (ref >60)
Glucose, Bld: 141 mg/dL — ABNORMAL HIGH (ref 70–99)
Potassium: 3.5 mmol/L (ref 3.5–5.1)
Sodium: 142 mmol/L (ref 135–145)

## 2018-05-08 IMAGING — DX DG CHEST 2V
2 series · 2 of 2 positions shown · non-contrast
Comparison: [DATE]

CLINICAL DATA: Dizziness.

EXAM:
CHEST - 2 VIEW

[chest pa]
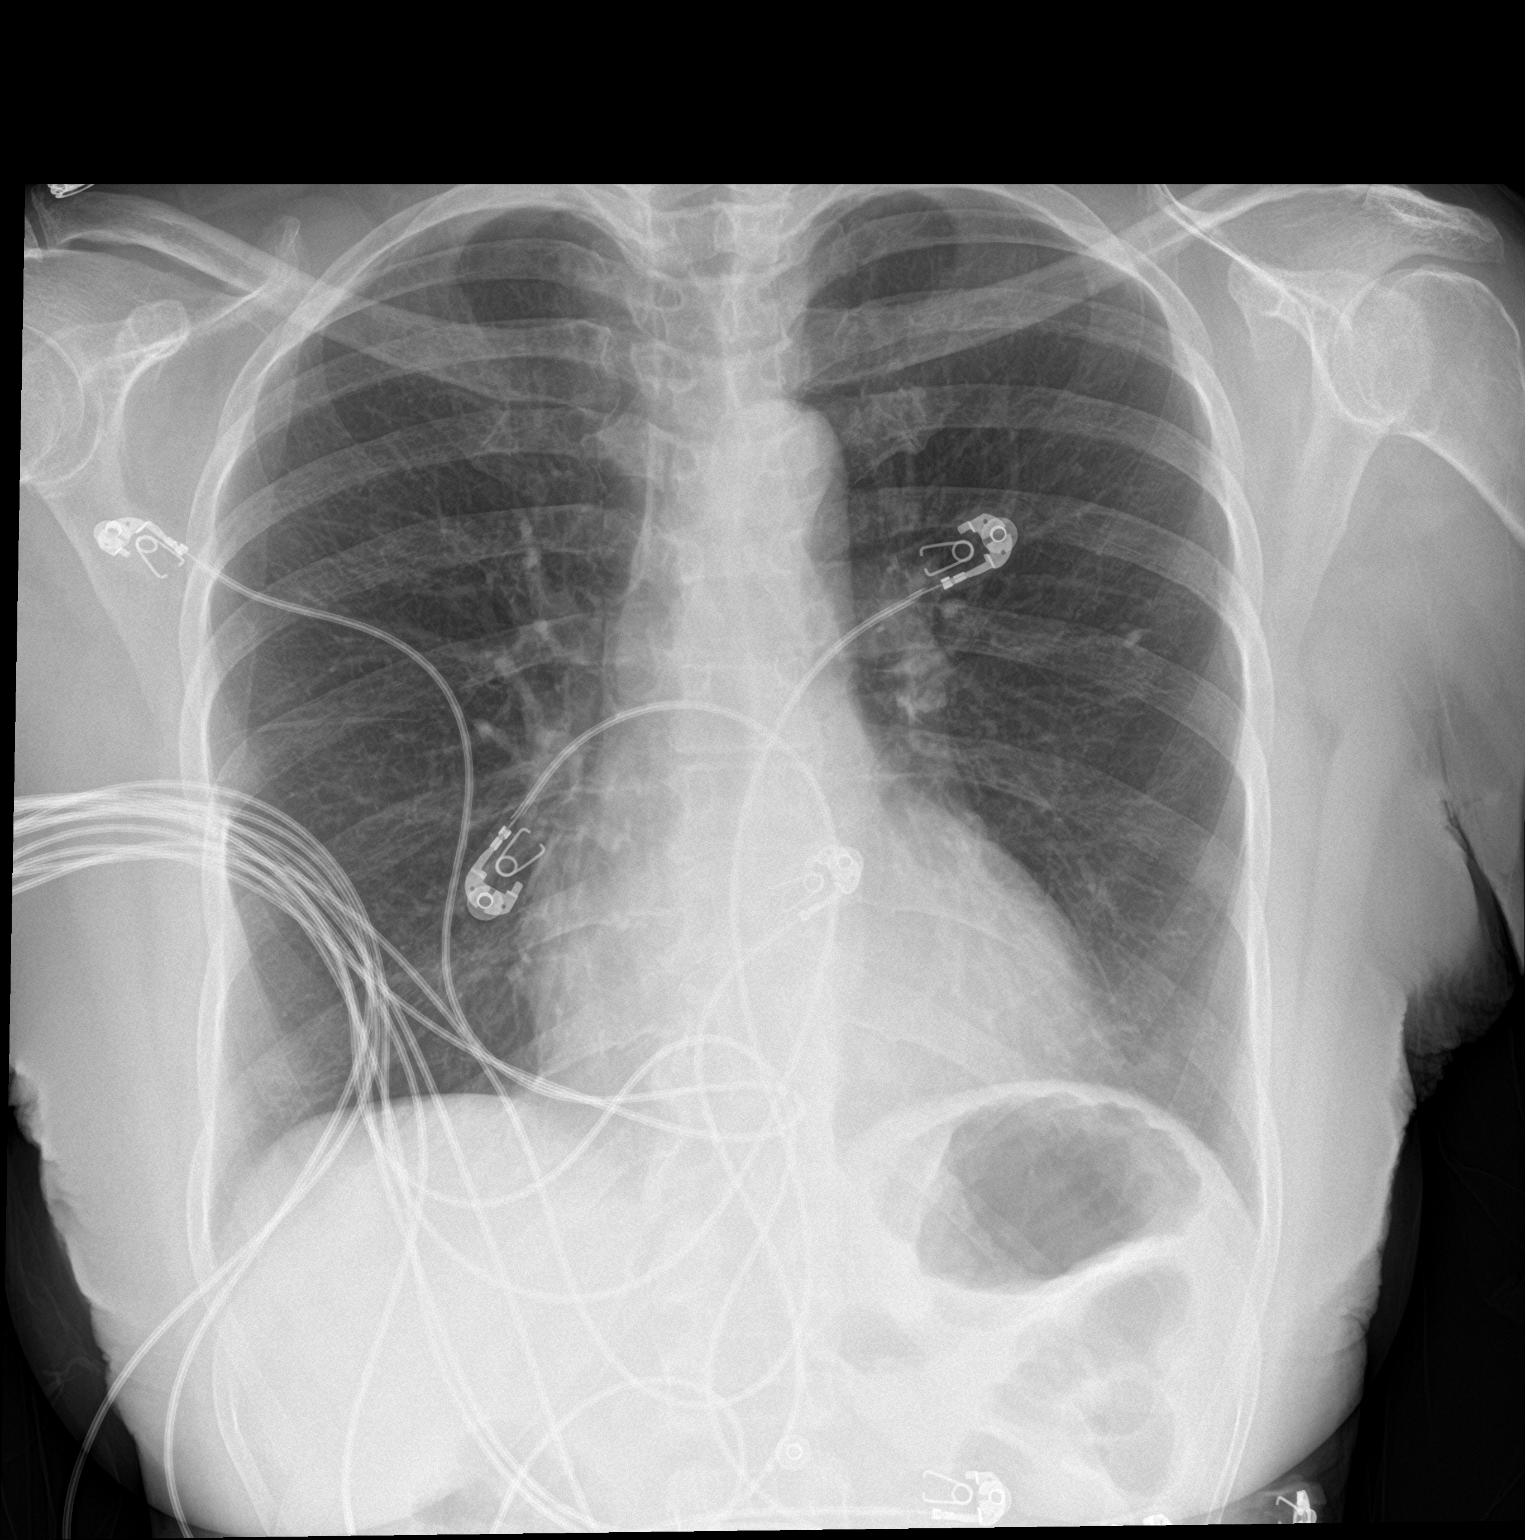

[chest lat]
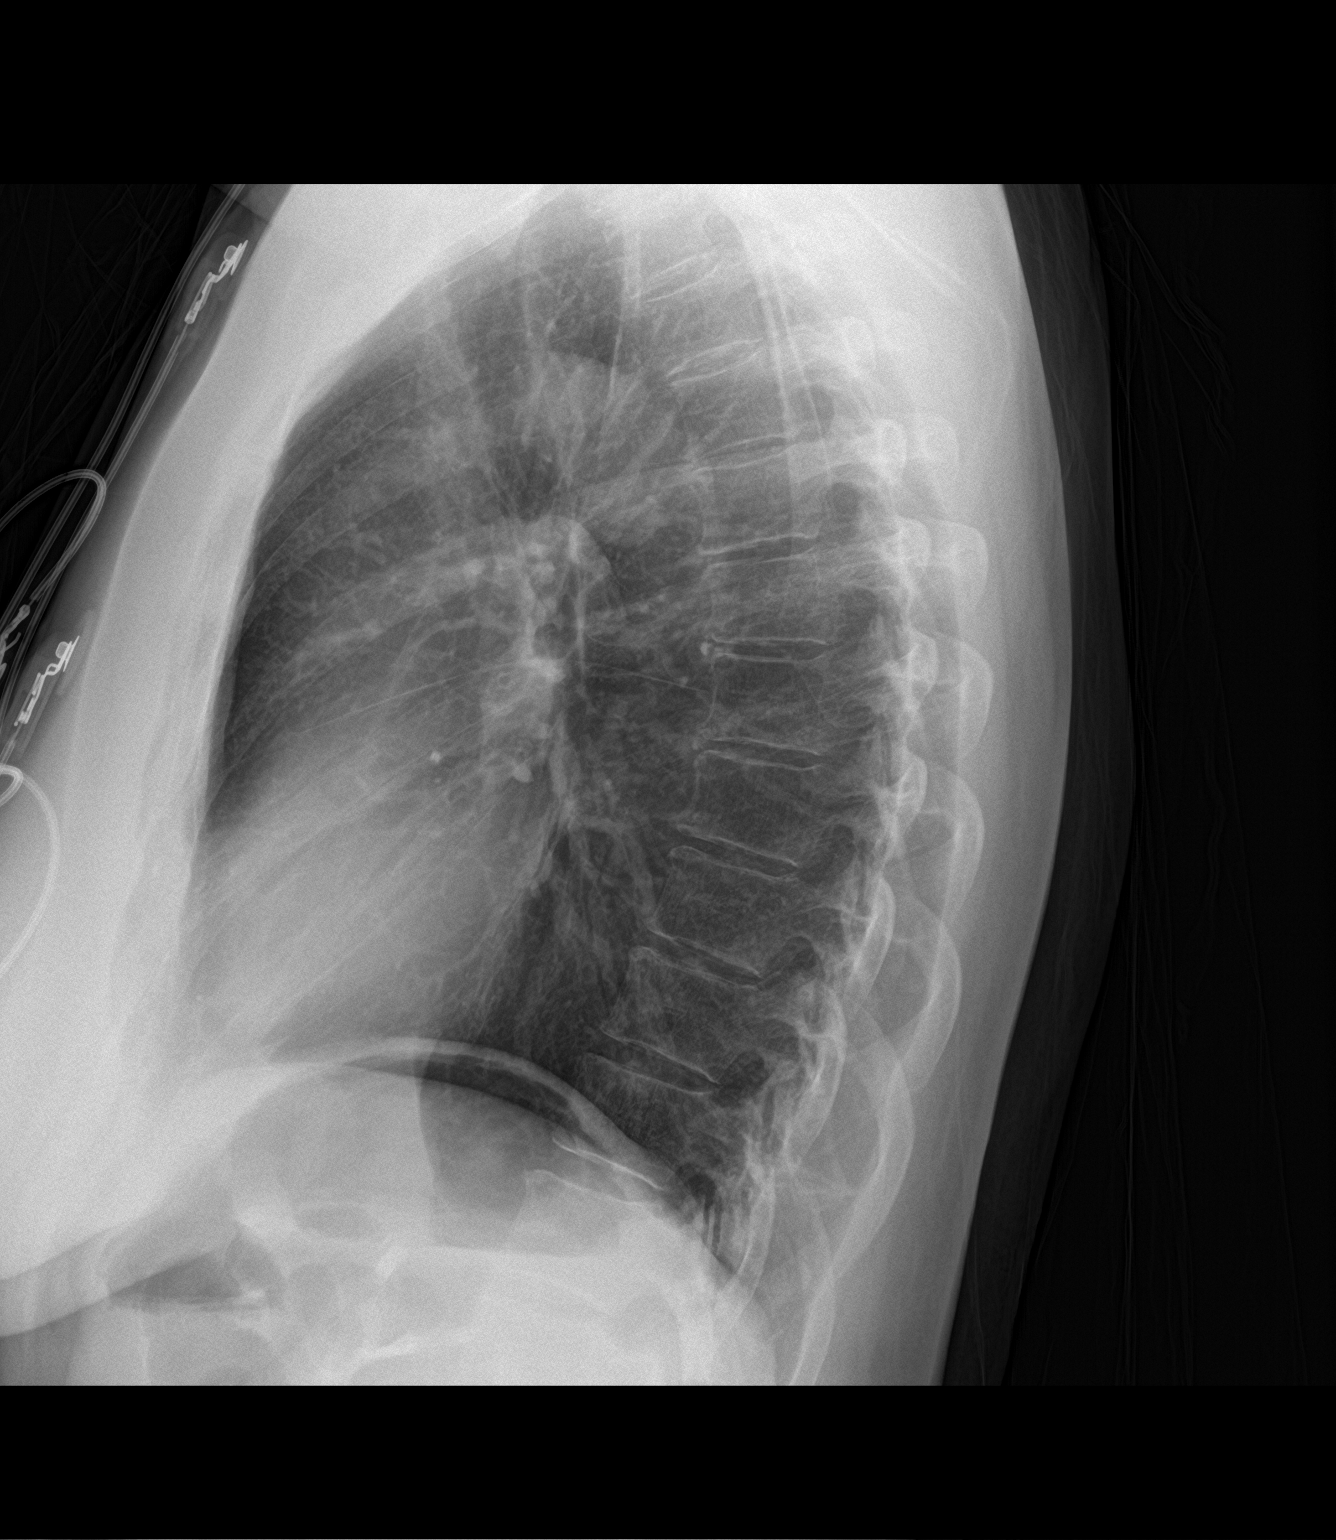

[2 of 2 positions shown; findings below may reference images not displayed]

FINDINGS: There is a calcified nodule in the left mid lung. The heart, hila,
mediastinum, lungs, and pleura are otherwise unremarkable.
IMPRESSION: No active cardiopulmonary disease.

## 2018-05-08 MED ORDER — SODIUM CHLORIDE 0.9 % IV BOLUS
500.0000 mL | Freq: Once | INTRAVENOUS | Status: AC
  Administered 2018-05-08: 500 mL via INTRAVENOUS

## 2018-05-08 MED ORDER — ASPIRIN 81 MG PO CHEW
324.0000 mg | CHEWABLE_TABLET | Freq: Once | ORAL | Status: AC
  Administered 2018-05-08: 324 mg via ORAL
  Filled 2018-05-08: qty 4

## 2018-05-08 NOTE — ED Provider Notes (Signed)
[facility] EMERGENCY DEPARTMENT Provider Note   CSN: [id number] Arrival date & time: 05/08/18  1215     History   Chief Complaint Chief Complaint  Patient presents with  . Dizziness    HPI Heather Austin is a 73 y.o. female.  HPI Pt presents with dizziness.  Pt was doing some moderate exercises today and then she felt like she was going to pass out.  She had to stop and rest. No chest pain or shortness of breath.  No vomiting diarrhea or fever.  NO focal weakness.  No headache.  The symptoms have resolved now but it took 6-7 minutes before it resolved.  She had one other episode a week or so ago.  She was not working out during that episode.   Past Medical History:  Diagnosis Date  . Asthma   . HTN (hypertension), benign   . Hyperlipidemia   . Multiple joint pain     Patient Active Problem List   Diagnosis Date Noted  . Moderate intermittent asthma with acute exacerbation 06/04/2016  . Rheumatoid arthritis ([place]) 06/04/2016  . DOE (dyspnea on exertion) 03/02/2013  . Asthma with COPD ([place]) 03/13/2012    No past surgical history on file.   OB History   No obstetric history on file.      Home Medications    Prior to Admission medications   Medication Sig Start Date End Date Taking? Authorizing Provider  albuterol (PROAIR HFA) 108 (90 Base) MCG/ACT inhaler inhale 2 puffs by mouth every 6 hours if needed 03/30/18   Baird Lyons D, MD  albuterol (PROVENTIL) (2.5 MG/3ML) 0.083% nebulizer solution 1 neb every 4-6 hours as needed for wheezing, asthma 03/30/18   Baird Lyons D, MD  alendronate (FOSAMAX) 70 MG tablet TAKE 1 TABLET BY MOUTH ONCE A WEEK ON AN EMPTY STOMACH 03/09/18   [provider]  amLODipine (NORVASC) 5 MG tablet TK 2 TS PO QD 03/07/18   [provider]  atorvastatin (LIPITOR) 40 MG tablet TK 1 T PO QD 03/09/18   [provider]  budesonide-formoterol (SYMBICORT) 160-4.5 MCG/ACT inhaler Inhale 2 puffs then  rinse mouth, twice daily 03/30/18   Baird Lyons D, MD  hydrochlorothiazide (HYDRODIURIL) 25 MG tablet  03/28/18   [provider]  leflunomide (ARAVA) 20 MG tablet Take 20 mg by mouth daily. 10/20/14   [provider]  losartan (COZAAR) 100 MG tablet TK 1 T PO QD 02/14/18   [provider]  potassium chloride (K-DUR) 10 MEQ tablet Take 10 mEq by mouth daily. 09/04/14   [provider]    Family History Family History  Problem Relation Age of Onset  . Asthma Sister   . Allergies Son   . Allergies Daughter     Social History Social History   Tobacco Use  . Smoking status: Never Smoker  . Smokeless tobacco: Never Used  Substance Use Topics  . Alcohol use: No  . Drug use: No     Allergies   Latex   Review of Systems Review of Systems  All other systems reviewed and are negative.    Physical Exam Updated Vital Signs BP 139/70   Pulse 65   Temp (!) 97.5 F (36.4 C) (Oral)   Resp 17   Ht 1.702 m (5\' 7" )   Wt 65.8 kg   SpO2 100%   BMI 22.71 kg/m   Physical Exam Vitals signs and nursing note reviewed.  Constitutional:  General: She is not in acute distress.    Appearance: She is well-developed.  HENT:     Head: Normocephalic and atraumatic.     Right Ear: External ear normal.     Left Ear: External ear normal.  Eyes:     General: No scleral icterus.       Right eye: No discharge.        Left eye: No discharge.     Conjunctiva/sclera: Conjunctivae normal.  Neck:     Musculoskeletal: Neck supple.     Trachea: No tracheal deviation.  Cardiovascular:     Rate and Rhythm: Normal rate and regular rhythm.     Heart sounds: Murmur (2/6 systolic) present.  Pulmonary:     Effort: Pulmonary effort is normal. No respiratory distress.     Breath sounds: Normal breath sounds. No stridor. No wheezing or rales.  Abdominal:     General: Bowel sounds are normal. There is no distension.     Palpations: Abdomen is soft.      Tenderness: There is no abdominal tenderness. There is no guarding or rebound.  Musculoskeletal:        General: No tenderness.  Skin:    General: Skin is warm and dry.     Findings: No rash.  Neurological:     Mental Status: She is alert and oriented to person, place, and time.     Cranial Nerves: No cranial nerve deficit (No facial droop, extraocular movements intact, tongue midline ).     Sensory: No sensory deficit.     Motor: No abnormal muscle tone or seizure activity.     Coordination: Coordination normal.     Comments: No pronator drift bilateral upper extrem, able to hold both legs off bed for 5 seconds, sensation intact in all extremities, no visual field cuts, no left or right sided neglect, normal finger-nose exam bilaterally, no nystagmus noted       ED Treatments / Results  Labs (all labs ordered are listed, but only abnormal results are displayed) Labs Reviewed  BASIC METABOLIC PANEL - Abnormal; Notable for the following components:      Result Value   Glucose, Bld 141 (*)    BUN 31 (*)    Creatinine, Ser 1.85 (*)    GFR calc non Af Amer 26 (*)    GFR calc Af Amer 31 (*)    All other components within normal limits  CBC  I-STAT TROPONIN, ED  I-STAT TROPONIN, ED    EKG EKG Interpretation  Date/Time:  Monday May 08 2018 12:19:29 EST Ventricular Rate:  80 PR Interval:    QRS Duration: 82 QT Interval:  393 QTC Calculation: 454 R Axis:   21 Text Interpretation:  Sinus rhythm Inferior infarct, age indeterminate Baseline wander in lead(s) II III aVF No old tracing to compare Confirmed by Dorie Rank [phone]) on 05/08/2018 12:30:01 PM   Radiology Dg Chest 2 View  Result Date: 05/08/2018 CLINICAL DATA:  Dizziness. EXAM: CHEST - 2 VIEW COMPARISON:  March 30, 2017 FINDINGS: There is a calcified nodule in the left mid lung. The heart, hila, mediastinum, lungs, and pleura are otherwise unremarkable. IMPRESSION: No active cardiopulmonary disease. Electronically  Signed   By: Dorise Bullion III M.D   On: 05/08/2018 15:00    Procedures Procedures (including critical care time)  Medications Ordered in ED Medications  aspirin chewable tablet 324 mg (324 mg Oral Given 05/08/18 1304)  sodium chloride 0.9 % bolus 500 mL (0 mLs  Intravenous Stopped 05/08/18 1435)     Initial Impression / Assessment and Plan / ED Course  I have reviewed the triage vital signs and the nursing notes.  Pertinent labs & imaging results that were available during my care of the patient were reviewed by me and considered in my medical decision making (see chart for details).  Clinical Course as of May 08 1546  Mon May 08, 2018  1534 Labs reviewed.  No significant abnormalities   [JK]    Clinical Course User Index [JK] Dorie Rank, MD    Patient presented to the emergency room for evaluation of dizziness.  She did not describe vertigo and the symptoms were more suggestive of near syncope lightheadedness.  Patient has not had any chest pain.  Doubt ACS or critical AS. She does not have any focal neurologic deficits.  Her ED work-up is reassuring.  Unclear etiology for her symptoms but she is feeling well and is ready for discharge.  I think she would benefit from further outpatient patient work-up including echocardiogram and possible cardiac monitoring.  Patient is agreeable and will contact her doctor for close follow-up.  Final Clinical Impressions(s) / ED Diagnoses   Final diagnoses:  Dizziness    ED Discharge Orders    None       Dorie Rank, MD 05/08/18 1549

## 2018-05-08 NOTE — ED Triage Notes (Signed)
Per [facility]: Patient to ED from [facility] c/o dizziness - patient states she began working out last week, had similar episode then but also has had intermittent dizziness since, worse with standing. Denies fevers, shortness of breath, chest pain, N/V. Endorses some lightheadedness. A&O x 4.

## 2018-05-08 NOTE — ED Notes (Signed)
Patient verbalized understanding of discharge instructions and denies any further needs or questions at this time. VS stable. Patient ambulatory with steady gait.  

## 2018-05-08 NOTE — Discharge Instructions (Addendum)
Follow-up with your doctor as we discussed for further evaluation.  Return to the emergency room for chest pain, numbness , weakness,  or other concerns

## 2018-05-08 NOTE — ED Notes (Signed)
Patient ambulatory to restroom and back. Denies dizziness and reports she is feeling better than earlier today.

## 2018-05-10 DIAGNOSIS — I10 Essential (primary) hypertension: Secondary | ICD-10-CM | POA: Diagnosis not present

## 2018-05-10 DIAGNOSIS — R42 Dizziness and giddiness: Secondary | ICD-10-CM | POA: Diagnosis not present

## 2018-06-12 ENCOUNTER — Encounter: Payer: Self-pay | Admitting: Cardiology

## 2018-06-12 ENCOUNTER — Ambulatory Visit: Payer: Medicare Other | Admitting: Cardiology

## 2018-06-12 VITALS — BP 117/82 | HR 85 | Ht 66.5 in | Wt 149.0 lb

## 2018-06-12 DIAGNOSIS — R55 Syncope and collapse: Secondary | ICD-10-CM

## 2018-06-12 DIAGNOSIS — I351 Nonrheumatic aortic (valve) insufficiency: Secondary | ICD-10-CM | POA: Diagnosis not present

## 2018-06-12 DIAGNOSIS — R42 Dizziness and giddiness: Secondary | ICD-10-CM | POA: Diagnosis not present

## 2018-06-12 DIAGNOSIS — I10 Essential (primary) hypertension: Secondary | ICD-10-CM

## 2018-06-12 NOTE — Progress Notes (Signed)
Patient referred by Lois Huxley, PA for presyncope  Subjective:   @Patient  ID: Heather Austin, female    DOB: 1946/03/28, 73 y.o.   MRN: [id number]  Chief Complaint  Patient presents with  . Aortic Stenosis    f/u tests  . Follow-up    HPI   73 y/o [place] female with hypertension, hyperlipidemia, asthma, CKD 3, now with exertional presyncope episodes.  Patient lives with her daughter and her dog. She started going to the gym in 04/2018. She felt dizzy after she got back home after exercising at the gym. Drinking water helped. She went to the gym the following week. She had another episode of new syncope after she finished 10 chest push exercises. This led to a visit to the ED with fairly unremarkable workup. She denied any wheezing with exercise.      Past Medical History:  Diagnosis Date  . Asthma    Since age 80  . HTN (hypertension), benign   . Hyperlipidemia   . Multiple joint pain   . Rheumatic arteritis     Past Surgical History:  Procedure Laterality Date  . CATARACT EXTRACTION Bilateral     Social History   Socioeconomic History  . Marital status: Single    Spouse name: Not on file  . Number of children: Not on file  . Years of education: Not on file  . Highest education level: Not on file  Occupational History  . Not on file  Social Needs  . Financial resource strain: Not on file  . Food insecurity:    Worry: Not on file    Inability: Not on file  . Transportation needs:    Medical: Not on file    Non-medical: Not on file  Tobacco Use  . Smoking status: Never Smoker  . Smokeless tobacco: Never Used  Substance and Sexual Activity  . Alcohol use: No  . Drug use: No  . Sexual activity: Not on file  Lifestyle  . Physical activity:    Days per week: Not on file    Minutes per session: Not on file  . Stress: Not on file  Relationships  . Social connections:    Talks on phone: Not on file    Gets together: Not on file    Attends  religious service: Not on file    Active member of club or organization: Not on file    Attends meetings of clubs or organizations: Not on file    Relationship status: Not on file  . Intimate partner violence:    Fear of current or ex partner: Not on file    Emotionally abused: Not on file    Physically abused: Not on file    Forced sexual activity: Not on file  Other Topics Concern  . Not on file  Social History Narrative  . Not on file    Current Outpatient Medications on File Prior to Visit  Medication Sig Dispense Refill  . albuterol (PROAIR HFA) 108 (90 Base) MCG/ACT inhaler inhale 2 puffs by mouth every 6 hours if needed 8.5 Inhaler 6  . albuterol (PROVENTIL) (2.5 MG/3ML) 0.083% nebulizer solution 1 neb every 4-6 hours as needed for wheezing, asthma 75 mL 12  . alendronate (FOSAMAX) 70 MG tablet TAKE 1 TABLET BY MOUTH ONCE A WEEK ON AN EMPTY STOMACH  2  . amLODipine (NORVASC) 5 MG tablet TK 2 TS PO QD  1  . aspirin EC 81 MG tablet Take 81  mg by mouth daily.    Marland Kitchen atorvastatin (LIPITOR) 40 MG tablet TK 1 T PO QD  1  . budesonide-formoterol (SYMBICORT) 160-4.5 MCG/ACT inhaler Inhale 2 puffs then rinse mouth, twice daily 1 Inhaler 12  . Cranberry-Vitamin C-Probiotic (AZO CRANBERRY) 250-30 MG TABS Take 1 tablet by mouth daily.    . ferrous sulfate 325 (65 FE) MG tablet Take 325 mg by mouth daily with breakfast.    . fexofenadine (ALLEGRA) 180 MG tablet Take 180 mg by mouth daily.    . hydrochlorothiazide (HYDRODIURIL) 25 MG tablet     . potassium chloride (K-DUR) 10 MEQ tablet Take 10 mEq by mouth daily.  0  . valsartan (DIOVAN) 320 MG tablet Take 1 tablet by mouth daily.     No current facility-administered medications on file prior to visit.     Cardiovascular studies:  EKG 05/08/2018: Sinus rhythm. Cannot exclude old inferior infarct. Otherwise normal EKG.  Echocardiogram 07/06/2017: - Left ventricle: The cavity size was normal. Wall thickness was   normal. Systolic  function was normal. The estimated ejection   fraction was in the range of 60% to 65%. Wall motion was normal;   there were no regional wall motion abnormalities. Features are   consistent with a pseudonormal left ventricular filling pattern,   with concomitant abnormal relaxation and increased filling   pressure (grade 2 diastolic dysfunction). - Aortic valve: Mildly calcified annulus. - Mitral valve: There was mild regurgitation.  Treadmill Exercise stress 06/07/2014:   Indications: Shortness of breath.  Conclusions: Negative for ischemia. Mildly reduced ex tolerenc. The patient exercised according to the [facility]   protocol, Total time recorded    4    Min.    33    sec. achieving a max heart rate of 160  which was 106% of MPHR for age and  7.3 METS of work. Baseline NIBP was 130/80. Peak NIBP was 130/80 MaxSysp was: 162 MaxDiasp was: 80. The baseline ECG showed NSR,Normal ECG.  During exercise there was No ST-T changes of ischemia. Symptoms: THR (91% MPHR), Dyspnea. Arrhythmia: Occasional PVC. Baseline artifact, but normal EKG at < 2 min in recovery suggests low risk stress..   Carotid US 03/2014: No evidence of carotid stenosis in the neck. Intimal thickening present in the common carotid arteries and minimal plaque present at the level of the left carotid bulb.  Labs 03/08/2018: Chol 203, TG 80, HDL 73, LDL 114.     Review of Systems  Constitution: Negative for decreased appetite, malaise/fatigue, weight gain and weight loss.  HENT: Negative for congestion.   Eyes: Negative for visual disturbance.  Cardiovascular: Negative for chest pain, dyspnea on exertion, leg swelling, palpitations and syncope.  Respiratory: Negative for shortness of breath.   Endocrine: Negative for cold intolerance.  Hematologic/Lymphatic: Does not bruise/bleed easily.  Skin: Negative for itching and rash.  Musculoskeletal: Negative for myalgias.  Gastrointestinal: Negative for abdominal pain, nausea and  vomiting.  Genitourinary: Negative for dysuria.  Neurological: Positive for light-headedness (With exercise). Negative for dizziness and weakness.  Psychiatric/Behavioral: The patient is not nervous/anxious.   All other systems reviewed and are negative.       Vitals:   06/12/18 1031  BP: 117/82  Pulse: 85  SpO2: 98%    Objective:   Physical Exam  Constitutional: She is oriented to person, place, and time. She appears well-developed and well-nourished. No distress.  HENT:  Head: Normocephalic and atraumatic.  Eyes: Pupils are equal, round, and reactive to light. Conjunctivae  are normal.  Neck: No JVD present.  Cardiovascular: Normal rate, regular rhythm and intact distal pulses.  Murmur (I/VI ejection systolic murmur at RUSB) heard. Pulmonary/Chest: Effort normal and breath sounds normal. She has no wheezes. She has no rales.  Abdominal: Soft. Bowel sounds are normal. There is no rebound.  Musculoskeletal:        General: No edema.  Lymphadenopathy:    She has no cervical adenopathy.  Neurological: She is alert and oriented to person, place, and time. No cranial nerve deficit.  Skin: Skin is warm and dry.  Psychiatric: She has a normal mood and affect.  Nursing note and vitals reviewed.         Assessment & Recommendations:   73 y/o [place] female with hypertension, hyperlipidemia, asthma, CKD 3, now with exertional presyncope episodes.   1. Postural dizziness with presyncope Most likely related to deconditioning. Suspicion for cardiogenic etiology is low. Nonetheless, will obtain exercise treadmill stress test and echocardiogram.  2. Aortic ejection murmur Echocardiogram as above.  3. Hypertension: Currently well controlled.  I will see her on as needed basis, unless significant abnormalities found on the above tests.    Thank you for referring the patient to [place]. Please feel free to contact with any questions.  Nigel Mormon, MD [facility]  Cardiovascular. [place] Pager: [phone] Office: [phone] If no answer Cell [phone]

## 2018-06-28 ENCOUNTER — Ambulatory Visit: Payer: Medicare Other

## 2018-06-28 DIAGNOSIS — R42 Dizziness and giddiness: Secondary | ICD-10-CM

## 2018-06-28 DIAGNOSIS — R55 Syncope and collapse: Secondary | ICD-10-CM | POA: Diagnosis not present

## 2018-06-28 DIAGNOSIS — I351 Nonrheumatic aortic (valve) insufficiency: Secondary | ICD-10-CM | POA: Diagnosis not present

## 2018-09-20 ENCOUNTER — Other Ambulatory Visit: Payer: Self-pay | Admitting: Internal Medicine

## 2018-09-28 DIAGNOSIS — M8580 Other specified disorders of bone density and structure, unspecified site: Secondary | ICD-10-CM | POA: Diagnosis not present

## 2018-09-28 DIAGNOSIS — E785 Hyperlipidemia, unspecified: Secondary | ICD-10-CM | POA: Diagnosis not present

## 2018-09-28 DIAGNOSIS — M0609 Rheumatoid arthritis without rheumatoid factor, multiple sites: Secondary | ICD-10-CM | POA: Diagnosis not present

## 2018-09-28 DIAGNOSIS — Z79899 Other long term (current) drug therapy: Secondary | ICD-10-CM | POA: Diagnosis not present

## 2018-11-09 DIAGNOSIS — J4520 Mild intermittent asthma, uncomplicated: Secondary | ICD-10-CM | POA: Diagnosis not present

## 2018-11-09 DIAGNOSIS — E785 Hyperlipidemia, unspecified: Secondary | ICD-10-CM | POA: Diagnosis not present

## 2018-11-09 DIAGNOSIS — M069 Rheumatoid arthritis, unspecified: Secondary | ICD-10-CM | POA: Diagnosis not present

## 2018-11-09 DIAGNOSIS — I10 Essential (primary) hypertension: Secondary | ICD-10-CM | POA: Diagnosis not present

## 2018-12-22 DIAGNOSIS — M069 Rheumatoid arthritis, unspecified: Secondary | ICD-10-CM | POA: Diagnosis not present

## 2018-12-22 DIAGNOSIS — I10 Essential (primary) hypertension: Secondary | ICD-10-CM | POA: Diagnosis not present

## 2018-12-22 DIAGNOSIS — J4520 Mild intermittent asthma, uncomplicated: Secondary | ICD-10-CM | POA: Diagnosis not present

## 2018-12-22 DIAGNOSIS — E785 Hyperlipidemia, unspecified: Secondary | ICD-10-CM | POA: Diagnosis not present

## 2019-01-04 ENCOUNTER — Encounter (HOSPITAL_BASED_OUTPATIENT_CLINIC_OR_DEPARTMENT_OTHER): Payer: Self-pay | Admitting: Emergency Medicine

## 2019-01-04 ENCOUNTER — Emergency Department (HOSPITAL_BASED_OUTPATIENT_CLINIC_OR_DEPARTMENT_OTHER): Payer: Medicare Other

## 2019-01-04 ENCOUNTER — Other Ambulatory Visit: Payer: Self-pay

## 2019-01-04 ENCOUNTER — Emergency Department (HOSPITAL_BASED_OUTPATIENT_CLINIC_OR_DEPARTMENT_OTHER)
Admission: EM | Admit: 2019-01-04 | Discharge: 2019-01-04 | Disposition: A | Discharge status: Home / Self Care | Payer: Medicare Other | Attending: Emergency Medicine | Admitting: Emergency Medicine

## 2019-01-04 DIAGNOSIS — E876 Hypokalemia: Secondary | ICD-10-CM | POA: Diagnosis not present

## 2019-01-04 DIAGNOSIS — R7989 Other specified abnormal findings of blood chemistry: Secondary | ICD-10-CM

## 2019-01-04 DIAGNOSIS — I10 Essential (primary) hypertension: Secondary | ICD-10-CM | POA: Insufficient documentation

## 2019-01-04 DIAGNOSIS — J45909 Unspecified asthma, uncomplicated: Secondary | ICD-10-CM | POA: Diagnosis not present

## 2019-01-04 DIAGNOSIS — R945 Abnormal results of liver function studies: Secondary | ICD-10-CM | POA: Insufficient documentation

## 2019-01-04 DIAGNOSIS — R531 Weakness: Secondary | ICD-10-CM | POA: Diagnosis not present

## 2019-01-04 DIAGNOSIS — D649 Anemia, unspecified: Secondary | ICD-10-CM | POA: Diagnosis not present

## 2019-01-04 DIAGNOSIS — R52 Pain, unspecified: Secondary | ICD-10-CM | POA: Diagnosis not present

## 2019-01-04 DIAGNOSIS — K824 Cholesterolosis of gallbladder: Secondary | ICD-10-CM | POA: Diagnosis not present

## 2019-01-04 DIAGNOSIS — M791 Myalgia, unspecified site: Secondary | ICD-10-CM | POA: Diagnosis present

## 2019-01-04 HISTORY — DX: Rheumatoid arthritis, unspecified: M06.9

## 2019-01-04 LAB — CBC
HCT: 29.8 % — ABNORMAL LOW (ref 36.0–46.0)
Hemoglobin: 9.3 g/dL — ABNORMAL LOW (ref 12.0–15.0)
MCH: 28.3 pg (ref 26.0–34.0)
MCHC: 31.2 g/dL (ref 30.0–36.0)
MCV: 90.6 fL (ref 80.0–100.0)
Platelets: 413 10*3/uL — ABNORMAL HIGH (ref 150–400)
RBC: 3.29 MIL/uL — ABNORMAL LOW (ref 3.87–5.11)
RDW: 13.2 % (ref 11.5–15.5)
WBC: 11.5 10*3/uL — ABNORMAL HIGH (ref 4.0–10.5)
nRBC: 0 % (ref 0.0–0.2)

## 2019-01-04 LAB — COMPREHENSIVE METABOLIC PANEL
ALT: 67 U/L — ABNORMAL HIGH (ref 0–44)
AST: 43 U/L — ABNORMAL HIGH (ref 15–41)
Albumin: 3.2 g/dL — ABNORMAL LOW (ref 3.5–5.0)
Alkaline Phosphatase: 144 U/L — ABNORMAL HIGH (ref 38–126)
Anion gap: 13 (ref 5–15)
BUN: 26 mg/dL — ABNORMAL HIGH (ref 8–23)
CO2: 23 mmol/L (ref 22–32)
Calcium: 9.1 mg/dL (ref 8.9–10.3)
Chloride: 100 mmol/L (ref 98–111)
Creatinine, Ser: 1.43 mg/dL — ABNORMAL HIGH (ref 0.44–1.00)
GFR calc Af Amer: 41 mL/min — ABNORMAL LOW (ref >60)
GFR calc non Af Amer: 36 mL/min — ABNORMAL LOW (ref >60)
Glucose, Bld: 133 mg/dL — ABNORMAL HIGH (ref 70–99)
Potassium: 3 mmol/L — ABNORMAL LOW (ref 3.5–5.1)
Sodium: 136 mmol/L (ref 135–145)
Total Bilirubin: 0.5 mg/dL (ref 0.3–1.2)
Total Protein: 7.5 g/dL (ref 6.5–8.1)

## 2019-01-04 LAB — URINALYSIS, ROUTINE W REFLEX MICROSCOPIC
Bilirubin Urine: NEGATIVE
Glucose, UA: NEGATIVE mg/dL
Hgb urine dipstick: NEGATIVE
Ketones, ur: NEGATIVE mg/dL
Leukocytes,Ua: NEGATIVE
Nitrite: NEGATIVE
Protein, ur: NEGATIVE mg/dL
Specific Gravity, Urine: 1.025 (ref 1.005–1.030)
pH: 5.5 (ref 5.0–8.0)

## 2019-01-04 LAB — CK: Total CK: 105 U/L (ref 38–234)

## 2019-01-04 LAB — OCCULT BLOOD X 1 CARD TO LAB, STOOL: Fecal Occult Bld: NEGATIVE

## 2019-01-04 IMAGING — US US ABDOMEN LIMITED
1 series · 14 of 25 positions shown · non-contrast
Comparison: None.

CLINICAL DATA: Elevated LFTs

EXAM:
ULTRASOUND ABDOMEN LIMITED RIGHT UPPER QUADRANT

[Series 1: us abdomen limited · 14 of 45 slices shown]
[im 1/45]
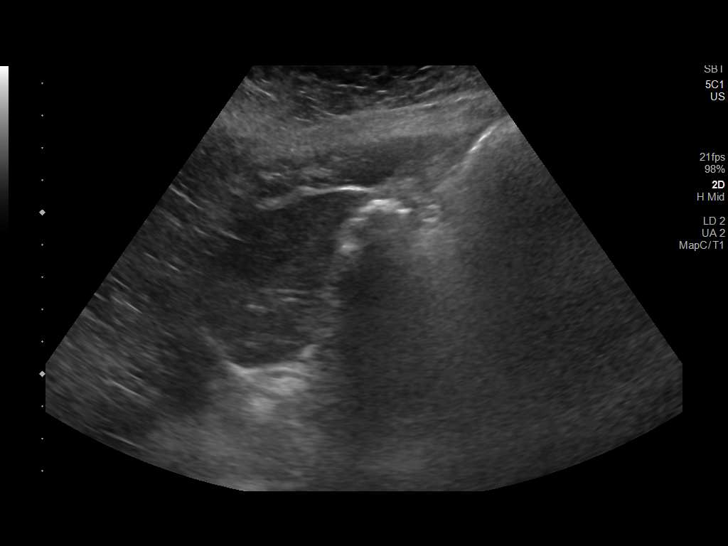
[im 4/45]
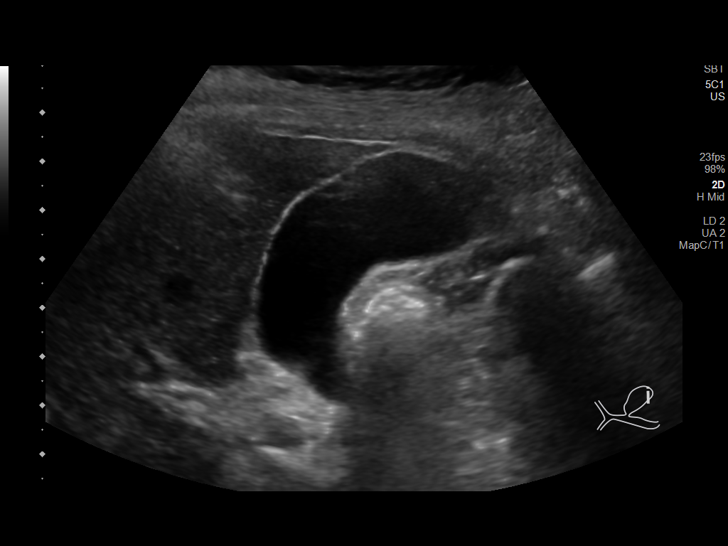
[im 8/45]
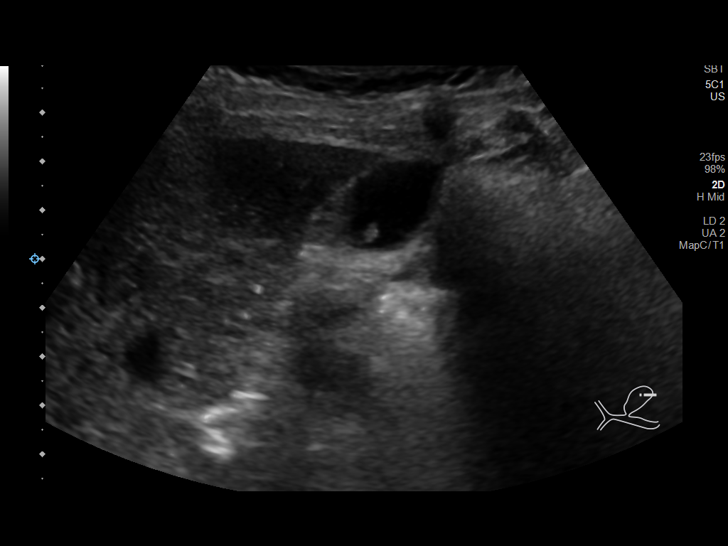
[im 12/45]
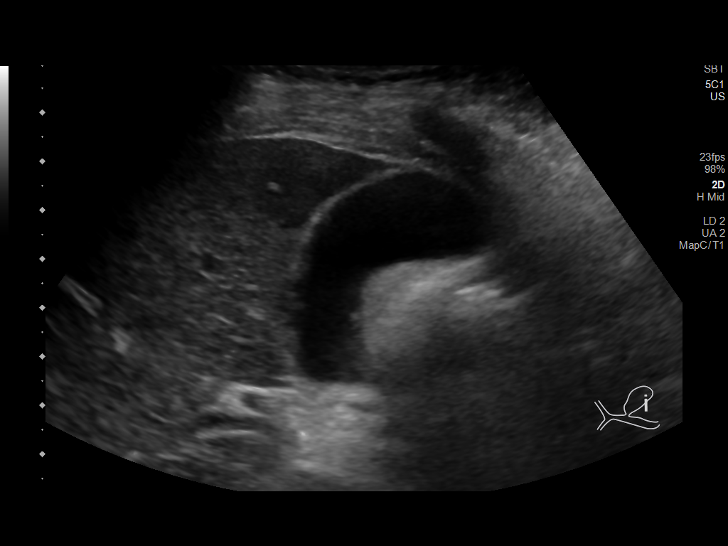
[im 15/45]
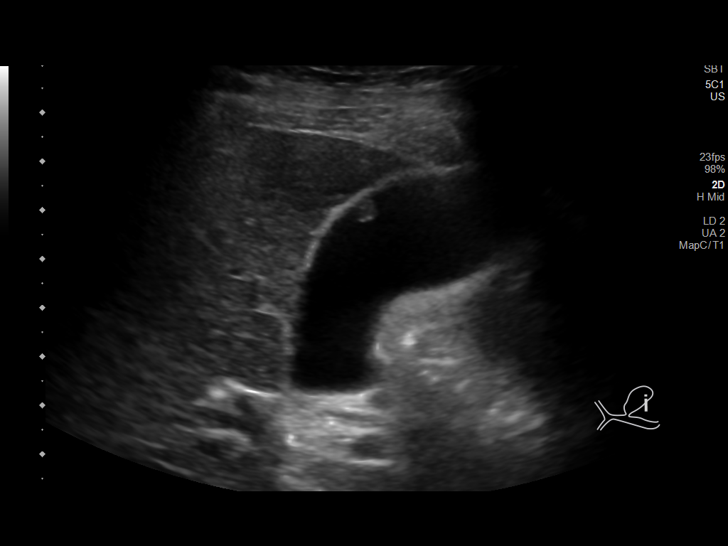
[im 17/45]
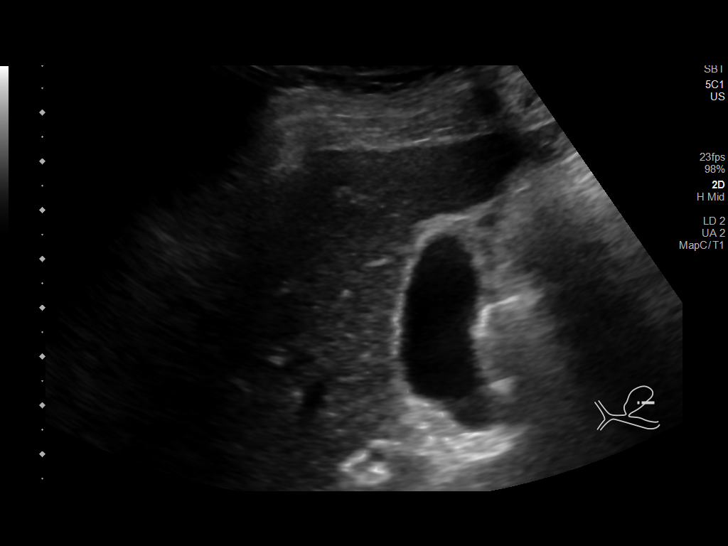
[im 21/45]
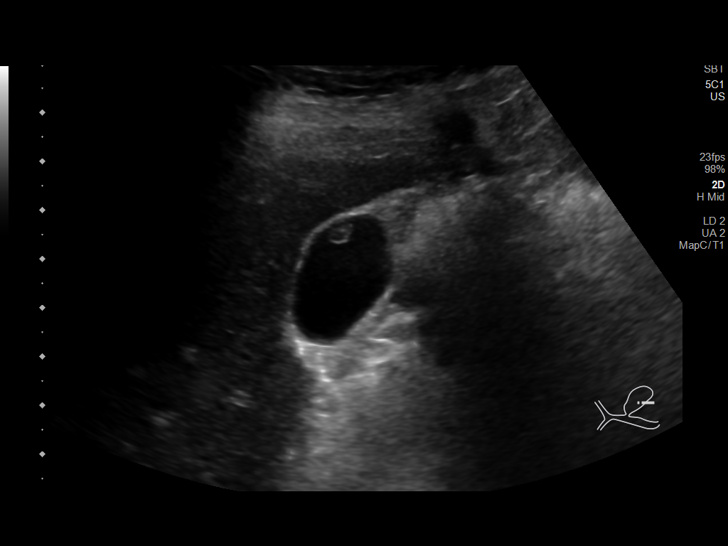
[im 24/45]
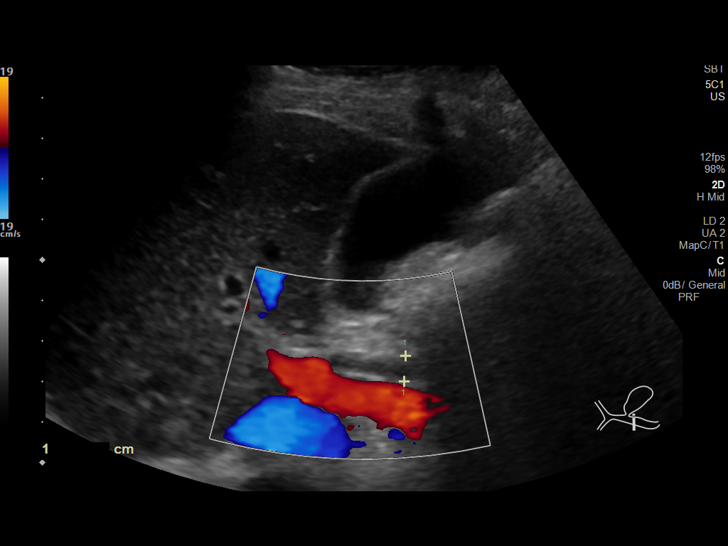
[im 28/45]
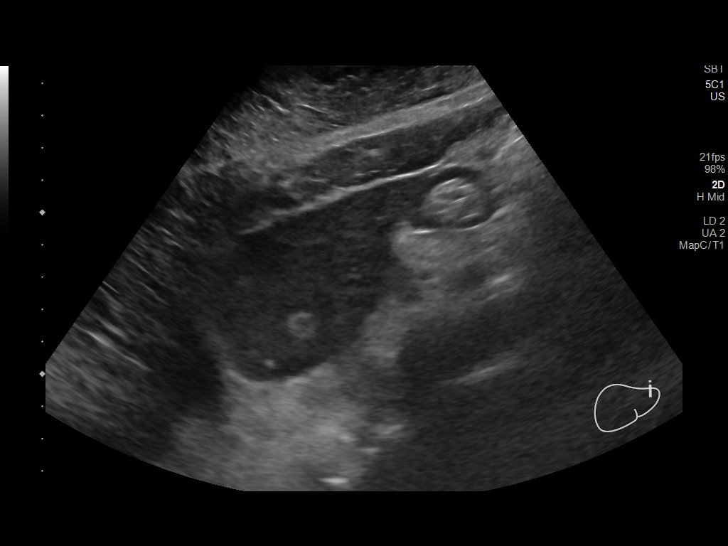
[im 30/45]
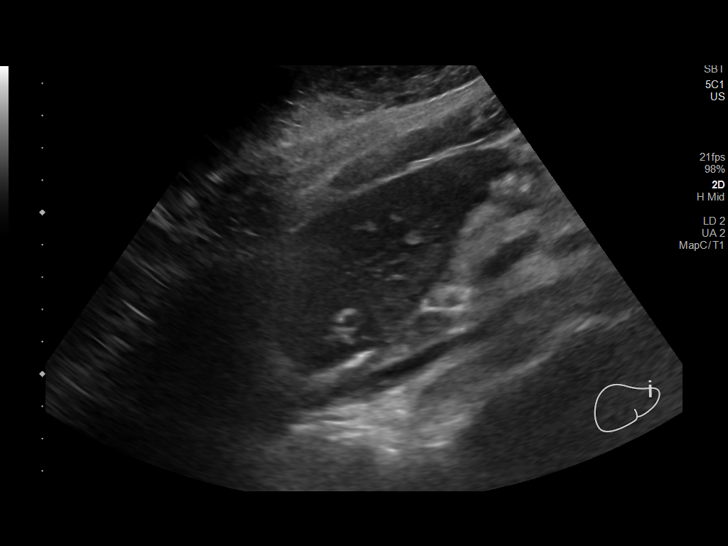
[im 34/45]
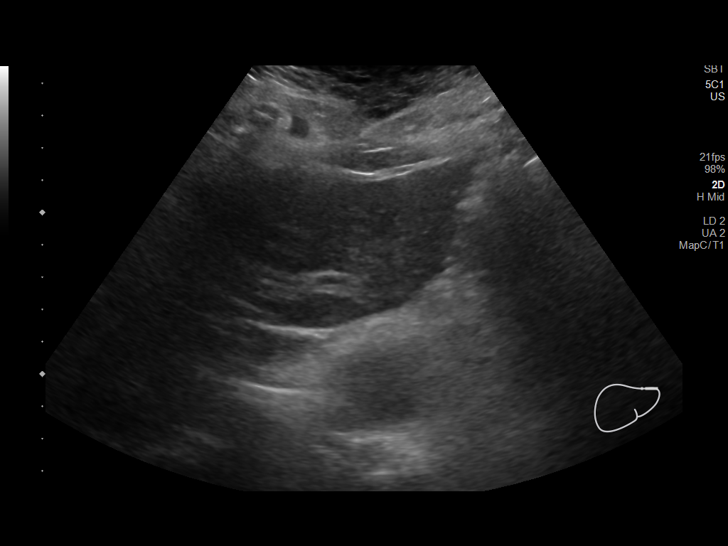
[im 37/45]
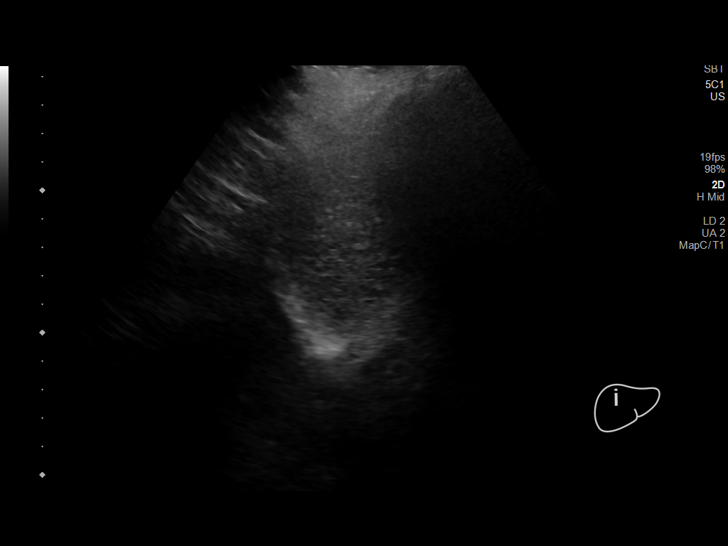
[im 41/45]
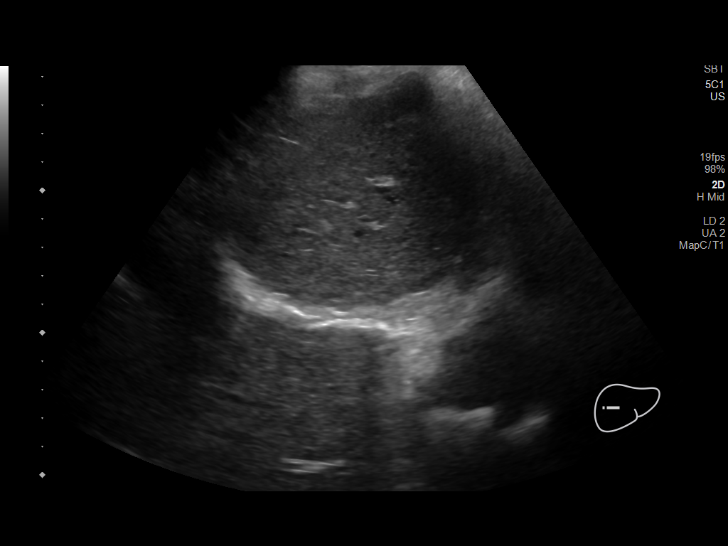
[im 45/45]
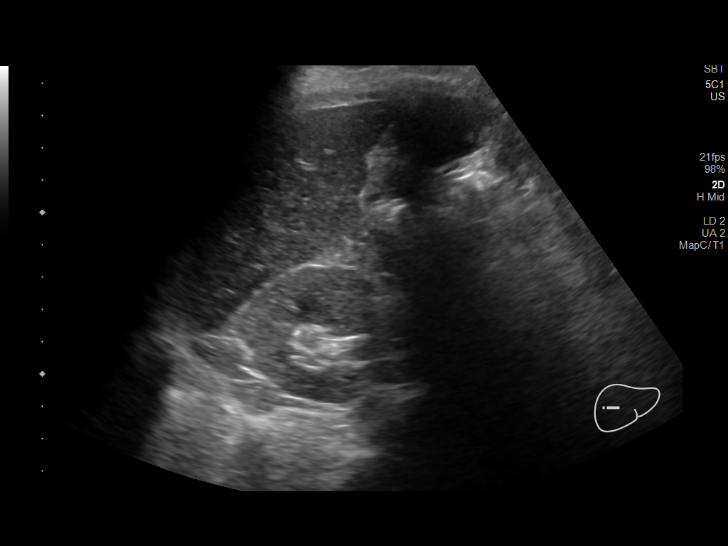

[14 of 25 positions shown; findings below may reference images not displayed]

FINDINGS: Gallbladder:

Few nonshadowing gallbladder polyps are present, the largest
measuring up to 4 mm in size. No gallstones or wall thickening
visualized. No sonographic Murphy sign noted by sonographer.

Common bile duct:

Diameter: 6 mm, nondilated

Liver:

No focal lesion identified. Within normal limits in parenchymal
echogenicity. Portal vein is patent on color Doppler imaging with
normal direction of blood flow towards the liver.

Other: None.
IMPRESSION: Few gallbladder polyps, largest measuring up to 4 mm size. Consider
follow-up ultrasound in 6 months to establish stability.

Otherwise unremarkable right upper quadrant ultrasound.

## 2019-01-04 MED ORDER — POTASSIUM CHLORIDE CRYS ER 20 MEQ PO TBCR
40.0000 meq | EXTENDED_RELEASE_TABLET | Freq: Once | ORAL | Status: AC
  Administered 2019-01-04: 15:00:00 40 meq via ORAL
  Filled 2019-01-04: qty 2

## 2019-01-04 NOTE — ED Provider Notes (Signed)
  Physical Exam  BP 136/66 (BP Location: Right Arm)   Pulse 89   Temp 98.8 F (37.1 C) (Oral)   Resp 18   SpO2 100%   Physical Exam  ED Course/Procedures     Procedures  MDM  Received care of patient from Dr. Estill Cotta at 3 PM.  Please see his note for prior history, physical and care.  Briefly this is a 73 year old female who is presenting with 1 month of generalized weakness, body aches.  Has mild hypokalemia and was given potassium.  Mild transaminitis and currently awaiting right upper quadrant ultrasound and CK.  Urinalysis, CK, right upper quadrant ultrasound showed no acute findings.  Labs also significant for hemoglobin of 9.3, decreased from previously normal values.  It is unclear over what period of time this may have changed.  She denies black or bloody stools.  Rectal exam was done which showed no evidence of melena, showed normal colored stools, which are actually Hemoccult negative.  Also discussed lower extremity edema with family, possibly secondary to hypoalbuminemia.  She has no shortness of breath, had normal oxygenation, do not feel emergent imaging or admission indicated, however do recommend further evaluation with PCP including anemia work-up, possible echocardiogram given lower extremity edema to evaluate for congestive heart failure, as well as repeat lab work and evaluation for fatigue. Patient discharged in stable condition with understanding of reasons to return.       Gareth Morgan, MD 01/06/19 [phone]

## 2019-01-04 NOTE — ED Triage Notes (Signed)
Pt states she is having difficulty with movement and walking due to stiffness.  Pt states she has been having issues since August.  Pt feels like her joints are stiff and its really hard to ambulate and even move.   Pt thought it was due to medication but had stopped her metoprolol and no changes have occurred.  Noticed some swollen joints in hands and some swelling in feet and ankles.  Pt states when she walks, she has bilateral feet aching and when she tries to hold onto something her hands hurt.

## 2019-01-04 NOTE — ED Provider Notes (Signed)
[facility] Emergency Department Provider Note MRN:  [id number]  Arrival date & time: 01/04/19     Chief Complaint   Generalized Body Aches   History of Present Illness   Heather Austin is a 73 y.o. year-old female with a history of hypertension, hyperlipidemia, rheumatoid arthritis presenting to the ED with chief complaint of generalized body aches.  1 month of generalized weakness, generalized body aches, trouble standing from seated position, trouble rolling around in bed.  Feels like her "body is giving up on me".  Denies fever, no cough, no chest pain, no shortness of breath, no abdominal pain.  No focal numbness or weakness to the arms or legs, no bowel or bladder dysfunction.  Symptoms are mild to moderate, no exacerbating or relieving factors.  Review of Systems  A complete 10 system review of systems was obtained and all systems are negative except as noted in the HPI and PMH.   Patient's Health History    Past Medical History:  Diagnosis Date  . Asthma    Since age 43  . HTN (hypertension), benign   . Hyperlipidemia   . Multiple joint pain   . Rheumatoid arthritis [facility])     Past Surgical History:  Procedure Laterality Date  . CATARACT EXTRACTION Bilateral     Family History  Problem Relation Age of Onset  . Hypertension Mother   . Asthma Sister   . Allergies Son   . Allergies Daughter     Social History   Socioeconomic History  . Marital status: Single    Spouse name: Not on file  . Number of children: Not on file  . Years of education: Not on file  . Highest education level: Not on file  Occupational History  . Not on file  Social Needs  . Financial resource strain: Not on file  . Food insecurity    Worry: Not on file    Inability: Not on file  . Transportation needs    Medical: Not on file    Non-medical: Not on file  Tobacco Use  . Smoking status: Never Smoker  . Smokeless tobacco: Never Used  Substance and Sexual  Activity  . Alcohol use: No  . Drug use: No  . Sexual activity: Not on file  Lifestyle  . Physical activity    Days per week: Not on file    Minutes per session: Not on file  . Stress: Not on file  Relationships  . Social Herbalist on phone: Not on file    Gets together: Not on file    Attends religious service: Not on file    Active member of club or organization: Not on file    Attends meetings of clubs or organizations: Not on file    Relationship status: Not on file  . Intimate partner violence    Fear of current or ex partner: Not on file    Emotionally abused: Not on file    Physically abused: Not on file    Forced sexual activity: Not on file  Other Topics Concern  . Not on file  Social History Narrative  . Not on file     Physical Exam  Vital Signs and Nursing Notes reviewed Vitals:   01/04/19 1330 01/04/19 1400  BP: (!) 143/70 (!) 141/69  Pulse: 75 78  Resp: (!) 21 (!) 25  Temp:    SpO2: 99% 100%    CONSTITUTIONAL: Well-appearing, NAD NEURO:  Alert  and oriented x 3, no focal deficits EYES:  eyes equal and reactive ENT/NECK:  no LAD, no JVD CARDIO: Regular rate, well-perfused, normal S1 and S2 PULM:  CTAB no wheezing or rhonchi GI/GU:  normal bowel sounds, non-distended, non-tender MSK/SPINE:  No gross deformities, no edema SKIN:  no rash, atraumatic PSYCH:  Appropriate speech and behavior  Diagnostic and Interventional Summary    EKG Interpretation  Date/Time:  Thursday January 04 2019 13:17:09 EDT Ventricular Rate:  71 PR Interval:    QRS Duration: 80 QT Interval:  407 QTC Calculation: 443 R Axis:   31 Text Interpretation:  Sinus rhythm Borderline T wave abnormalities Confirmed by Gerlene Fee [id number]) on 01/04/2019 2:54:56 PM      Labs Reviewed  CBC - Abnormal; Notable for the following components:      Result Value   WBC 11.5 (*)    RBC 3.29 (*)    Hemoglobin 9.3 (*)    HCT 29.8 (*)    Platelets 413 (*)    All other  components within normal limits  COMPREHENSIVE METABOLIC PANEL - Abnormal; Notable for the following components:   Potassium 3.0 (*)    Glucose, Bld 133 (*)    BUN 26 (*)    Creatinine, Ser 1.43 (*)    Albumin 3.2 (*)    AST 43 (*)    ALT 67 (*)    Alkaline Phosphatase 144 (*)    GFR calc non Af Amer 36 (*)    GFR calc Af Amer 41 (*)    All other components within normal limits  URINALYSIS, ROUTINE W REFLEX MICROSCOPIC  CK    US Abdomen Limited RUQ    (Results Pending)    Medications  potassium chloride SA (K-DUR) CR tablet 40 mEq (has no administration in time range)     Procedures Critical Care  ED Course and Medical Decision Making  I have reviewed the triage vital signs and the nursing notes.  Pertinent labs & imaging results that were available during my care of the patient were reviewed by me and considered in my medical decision making (see below for details).  Favoring deconditioning in this 73 year old female who explains that because of coronavirus she has not been exercising.  Also considering UTI, metabolic disarray, rhabdo, RA flare.  She is however well-appearing with normal vital signs and with a negative work-up will be appropriate for PCP follow-up.  Labs reveal minimal leukocytosis, mild LFT elevation.  We will follow-up with right upper quadrant ultrasound to exclude cholecystitis.  Still awaiting ultrasound and CK, signed out to oncoming provider at shift change.  Barth Kirks. Sedonia Small, MD [place] [email or web address]  Final Clinical Impressions(s) / ED Diagnoses     ICD-10-CM   1. Generalized weakness  R53.1   2. LFT elevation  R94.5 US Abdomen Limited RUQ    US Abdomen Limited RUQ  3. Body aches  R52   4. Hypokalemia  E87.6     ED Discharge Orders    None      Discharge Instructions Discussed with and Provided to Patient: Discharge Instructions   None       Maudie Flakes, MD 01/04/19 1505

## 2019-01-10 DIAGNOSIS — M0609 Rheumatoid arthritis without rheumatoid factor, multiple sites: Secondary | ICD-10-CM | POA: Diagnosis not present

## 2019-01-10 DIAGNOSIS — M130 Polyarthritis, unspecified: Secondary | ICD-10-CM | POA: Diagnosis not present

## 2019-01-10 DIAGNOSIS — M069 Rheumatoid arthritis, unspecified: Secondary | ICD-10-CM | POA: Diagnosis not present

## 2019-01-10 DIAGNOSIS — M8580 Other specified disorders of bone density and structure, unspecified site: Secondary | ICD-10-CM | POA: Diagnosis not present

## 2019-01-10 DIAGNOSIS — E785 Hyperlipidemia, unspecified: Secondary | ICD-10-CM | POA: Diagnosis not present

## 2019-01-16 DIAGNOSIS — M0609 Rheumatoid arthritis without rheumatoid factor, multiple sites: Secondary | ICD-10-CM | POA: Diagnosis not present

## 2019-01-19 DIAGNOSIS — M069 Rheumatoid arthritis, unspecified: Secondary | ICD-10-CM | POA: Diagnosis not present

## 2019-01-19 DIAGNOSIS — J4520 Mild intermittent asthma, uncomplicated: Secondary | ICD-10-CM | POA: Diagnosis not present

## 2019-01-19 DIAGNOSIS — E785 Hyperlipidemia, unspecified: Secondary | ICD-10-CM | POA: Diagnosis not present

## 2019-01-19 DIAGNOSIS — I10 Essential (primary) hypertension: Secondary | ICD-10-CM | POA: Diagnosis not present

## 2019-01-26 DIAGNOSIS — M0609 Rheumatoid arthritis without rheumatoid factor, multiple sites: Secondary | ICD-10-CM | POA: Diagnosis not present

## 2019-01-26 DIAGNOSIS — R7989 Other specified abnormal findings of blood chemistry: Secondary | ICD-10-CM | POA: Diagnosis not present

## 2019-01-26 DIAGNOSIS — M8580 Other specified disorders of bone density and structure, unspecified site: Secondary | ICD-10-CM | POA: Diagnosis not present

## 2019-01-26 DIAGNOSIS — M1990 Unspecified osteoarthritis, unspecified site: Secondary | ICD-10-CM | POA: Diagnosis not present

## 2019-01-26 DIAGNOSIS — E785 Hyperlipidemia, unspecified: Secondary | ICD-10-CM | POA: Diagnosis not present

## 2019-02-15 DIAGNOSIS — Z0000 Encounter for general adult medical examination without abnormal findings: Secondary | ICD-10-CM | POA: Diagnosis not present

## 2019-02-15 DIAGNOSIS — E559 Vitamin D deficiency, unspecified: Secondary | ICD-10-CM | POA: Diagnosis not present

## 2019-02-15 DIAGNOSIS — I10 Essential (primary) hypertension: Secondary | ICD-10-CM | POA: Diagnosis not present

## 2019-02-15 DIAGNOSIS — Z23 Encounter for immunization: Secondary | ICD-10-CM | POA: Diagnosis not present

## 2019-02-15 DIAGNOSIS — E785 Hyperlipidemia, unspecified: Secondary | ICD-10-CM | POA: Diagnosis not present

## 2019-02-15 DIAGNOSIS — Z1389 Encounter for screening for other disorder: Secondary | ICD-10-CM | POA: Diagnosis not present

## 2019-02-22 DIAGNOSIS — M069 Rheumatoid arthritis, unspecified: Secondary | ICD-10-CM | POA: Diagnosis not present

## 2019-02-22 DIAGNOSIS — E785 Hyperlipidemia, unspecified: Secondary | ICD-10-CM | POA: Diagnosis not present

## 2019-02-22 DIAGNOSIS — J4520 Mild intermittent asthma, uncomplicated: Secondary | ICD-10-CM | POA: Diagnosis not present

## 2019-02-22 DIAGNOSIS — I10 Essential (primary) hypertension: Secondary | ICD-10-CM | POA: Diagnosis not present

## 2019-03-19 DIAGNOSIS — R739 Hyperglycemia, unspecified: Secondary | ICD-10-CM | POA: Diagnosis not present

## 2019-03-19 DIAGNOSIS — E785 Hyperlipidemia, unspecified: Secondary | ICD-10-CM | POA: Diagnosis not present

## 2019-03-19 DIAGNOSIS — Z79899 Other long term (current) drug therapy: Secondary | ICD-10-CM | POA: Diagnosis not present

## 2019-04-03 ENCOUNTER — Ambulatory Visit: Payer: Medicare Other | Admitting: Internal Medicine

## 2019-04-26 DIAGNOSIS — E7800 Pure hypercholesterolemia, unspecified: Secondary | ICD-10-CM | POA: Diagnosis not present

## 2019-04-26 DIAGNOSIS — E785 Hyperlipidemia, unspecified: Secondary | ICD-10-CM | POA: Diagnosis not present

## 2019-04-26 DIAGNOSIS — I10 Essential (primary) hypertension: Secondary | ICD-10-CM | POA: Diagnosis not present

## 2019-04-26 DIAGNOSIS — J4520 Mild intermittent asthma, uncomplicated: Secondary | ICD-10-CM | POA: Diagnosis not present

## 2019-04-26 DIAGNOSIS — M069 Rheumatoid arthritis, unspecified: Secondary | ICD-10-CM | POA: Diagnosis not present

## 2019-05-25 DIAGNOSIS — M810 Age-related osteoporosis without current pathological fracture: Secondary | ICD-10-CM | POA: Diagnosis not present

## 2019-05-25 DIAGNOSIS — E7800 Pure hypercholesterolemia, unspecified: Secondary | ICD-10-CM | POA: Diagnosis not present

## 2019-05-25 DIAGNOSIS — Z0110 Encounter for examination of ears and hearing without abnormal findings: Secondary | ICD-10-CM | POA: Diagnosis not present

## 2019-05-25 DIAGNOSIS — I10 Essential (primary) hypertension: Secondary | ICD-10-CM | POA: Diagnosis not present

## 2019-05-25 DIAGNOSIS — E785 Hyperlipidemia, unspecified: Secondary | ICD-10-CM | POA: Diagnosis not present

## 2019-05-25 DIAGNOSIS — Z136 Encounter for screening for cardiovascular disorders: Secondary | ICD-10-CM | POA: Diagnosis not present

## 2019-05-25 DIAGNOSIS — M069 Rheumatoid arthritis, unspecified: Secondary | ICD-10-CM | POA: Diagnosis not present

## 2019-05-25 DIAGNOSIS — E782 Mixed hyperlipidemia: Secondary | ICD-10-CM | POA: Diagnosis not present

## 2019-05-25 DIAGNOSIS — J4520 Mild intermittent asthma, uncomplicated: Secondary | ICD-10-CM | POA: Diagnosis not present

## 2019-05-25 DIAGNOSIS — E119 Type 2 diabetes mellitus without complications: Secondary | ICD-10-CM | POA: Diagnosis not present

## 2019-05-25 DIAGNOSIS — Z131 Encounter for screening for diabetes mellitus: Secondary | ICD-10-CM | POA: Diagnosis not present

## 2019-05-25 DIAGNOSIS — M19041 Primary osteoarthritis, right hand: Secondary | ICD-10-CM | POA: Diagnosis not present

## 2019-06-17 ENCOUNTER — Ambulatory Visit: Payer: Medicare Other | Attending: Internal Medicine

## 2019-06-17 DIAGNOSIS — Z23 Encounter for immunization: Secondary | ICD-10-CM

## 2019-06-17 NOTE — Progress Notes (Signed)
   Covid-19 Vaccination Clinic  Name:  Heather Austin    MRN: [id number] DOB: Sep 16, 1945  06/17/2019  Ms. Dust was observed post Covid-19 immunization for 15 minutes without incidence. She was provided with Vaccine Information Sheet and instruction to access the V-Safe system.   [name] was instructed to call 911 with any severe reactions post vaccine: Marland Kitchen Difficulty breathing  . Swelling of your face and throat  . A fast heartbeat  . A bad rash all over your body  . Dizziness and weakness    Immunizations Administered    Name Date Dose VIS Date Route   Pfizer COVID-19 Vaccine 06/17/2019  9:53 AM 0.3 mL 04/06/2019 Intramuscular   Manufacturer: [place]   Lot: [id number]   [place]: [id number]

## 2019-07-11 ENCOUNTER — Ambulatory Visit: Payer: Medicare Other | Attending: Internal Medicine

## 2019-07-11 DIAGNOSIS — Z23 Encounter for immunization: Secondary | ICD-10-CM

## 2019-07-11 NOTE — Progress Notes (Signed)
   Covid-19 Vaccination Clinic  Name:  Heather Austin    MRN: [id number] DOB: Nov 16, 1945  07/11/2019  [name] was observed post Covid-19 immunization for 15 minutes without incident. She was provided with Vaccine Information Sheet and instruction to access the V-Safe system.   [name] was instructed to call 911 with any severe reactions post vaccine: Marland Kitchen Difficulty breathing  . Swelling of face and throat  . A fast heartbeat  . A bad rash all over body  . Dizziness and weakness   Immunizations Administered    Name Date Dose VIS Date Route   Pfizer COVID-19 Vaccine 07/11/2019  2:19 PM 0.3 mL 04/06/2019 Intramuscular   Manufacturer: [place]   Lot: [id number]   [place]: [id number]

## 2019-09-04 ENCOUNTER — Telehealth: Payer: Self-pay | Admitting: Internal Medicine

## 2019-09-04 NOTE — Telephone Encounter (Signed)
Called and spoke with patient in regards to getting a request from Dr. [facility] to get last OV faxed over as well as labs and treatment. She confirmed that this was ok to send to them. Records have been sent through [facility]. Nothing further needed at this time.

## 2020-05-08 DIAGNOSIS — Z23 Encounter for immunization: Secondary | ICD-10-CM | POA: Diagnosis not present

## 2020-05-08 DIAGNOSIS — I10 Essential (primary) hypertension: Secondary | ICD-10-CM | POA: Diagnosis not present

## 2020-05-08 DIAGNOSIS — M069 Rheumatoid arthritis, unspecified: Secondary | ICD-10-CM | POA: Diagnosis not present

## 2020-05-08 DIAGNOSIS — J449 Chronic obstructive pulmonary disease, unspecified: Secondary | ICD-10-CM | POA: Diagnosis not present

## 2020-05-08 DIAGNOSIS — R7303 Prediabetes: Secondary | ICD-10-CM | POA: Diagnosis not present

## 2020-05-15 DIAGNOSIS — J449 Chronic obstructive pulmonary disease, unspecified: Secondary | ICD-10-CM | POA: Diagnosis not present

## 2020-05-17 ENCOUNTER — Ambulatory Visit (HOSPITAL_COMMUNITY)
Admission: EM | Admit: 2020-05-17 | Discharge: 2020-05-17 | Disposition: A | Discharge status: Home / Self Care | Payer: Medicare Other | Attending: Urgent Care | Admitting: Urgent Care

## 2020-05-17 ENCOUNTER — Encounter (HOSPITAL_COMMUNITY): Payer: Self-pay | Admitting: Urgent Care

## 2020-05-17 ENCOUNTER — Other Ambulatory Visit: Payer: Self-pay

## 2020-05-17 ENCOUNTER — Ambulatory Visit (INDEPENDENT_AMBULATORY_CARE_PROVIDER_SITE_OTHER): Payer: Medicare Other

## 2020-05-17 DIAGNOSIS — R0789 Other chest pain: Secondary | ICD-10-CM | POA: Diagnosis not present

## 2020-05-17 DIAGNOSIS — R059 Cough, unspecified: Secondary | ICD-10-CM

## 2020-05-17 DIAGNOSIS — R0602 Shortness of breath: Secondary | ICD-10-CM | POA: Insufficient documentation

## 2020-05-17 DIAGNOSIS — J4540 Moderate persistent asthma, uncomplicated: Secondary | ICD-10-CM

## 2020-05-17 DIAGNOSIS — Z20822 Contact with and (suspected) exposure to covid-19: Secondary | ICD-10-CM | POA: Insufficient documentation

## 2020-05-17 DIAGNOSIS — Z7951 Long term (current) use of inhaled steroids: Secondary | ICD-10-CM | POA: Insufficient documentation

## 2020-05-17 DIAGNOSIS — Z79899 Other long term (current) drug therapy: Secondary | ICD-10-CM | POA: Diagnosis not present

## 2020-05-17 LAB — SARS CORONAVIRUS 2 (TAT 6-24 HRS): SARS Coronavirus 2: NEGATIVE

## 2020-05-17 IMAGING — DX DG CHEST 2V
2 series · 2 of 2 positions shown · non-contrast
Comparison: [DATE]

CLINICAL DATA: Cough, shortness of breath

EXAM:
CHEST - 2 VIEW

[chest pa]
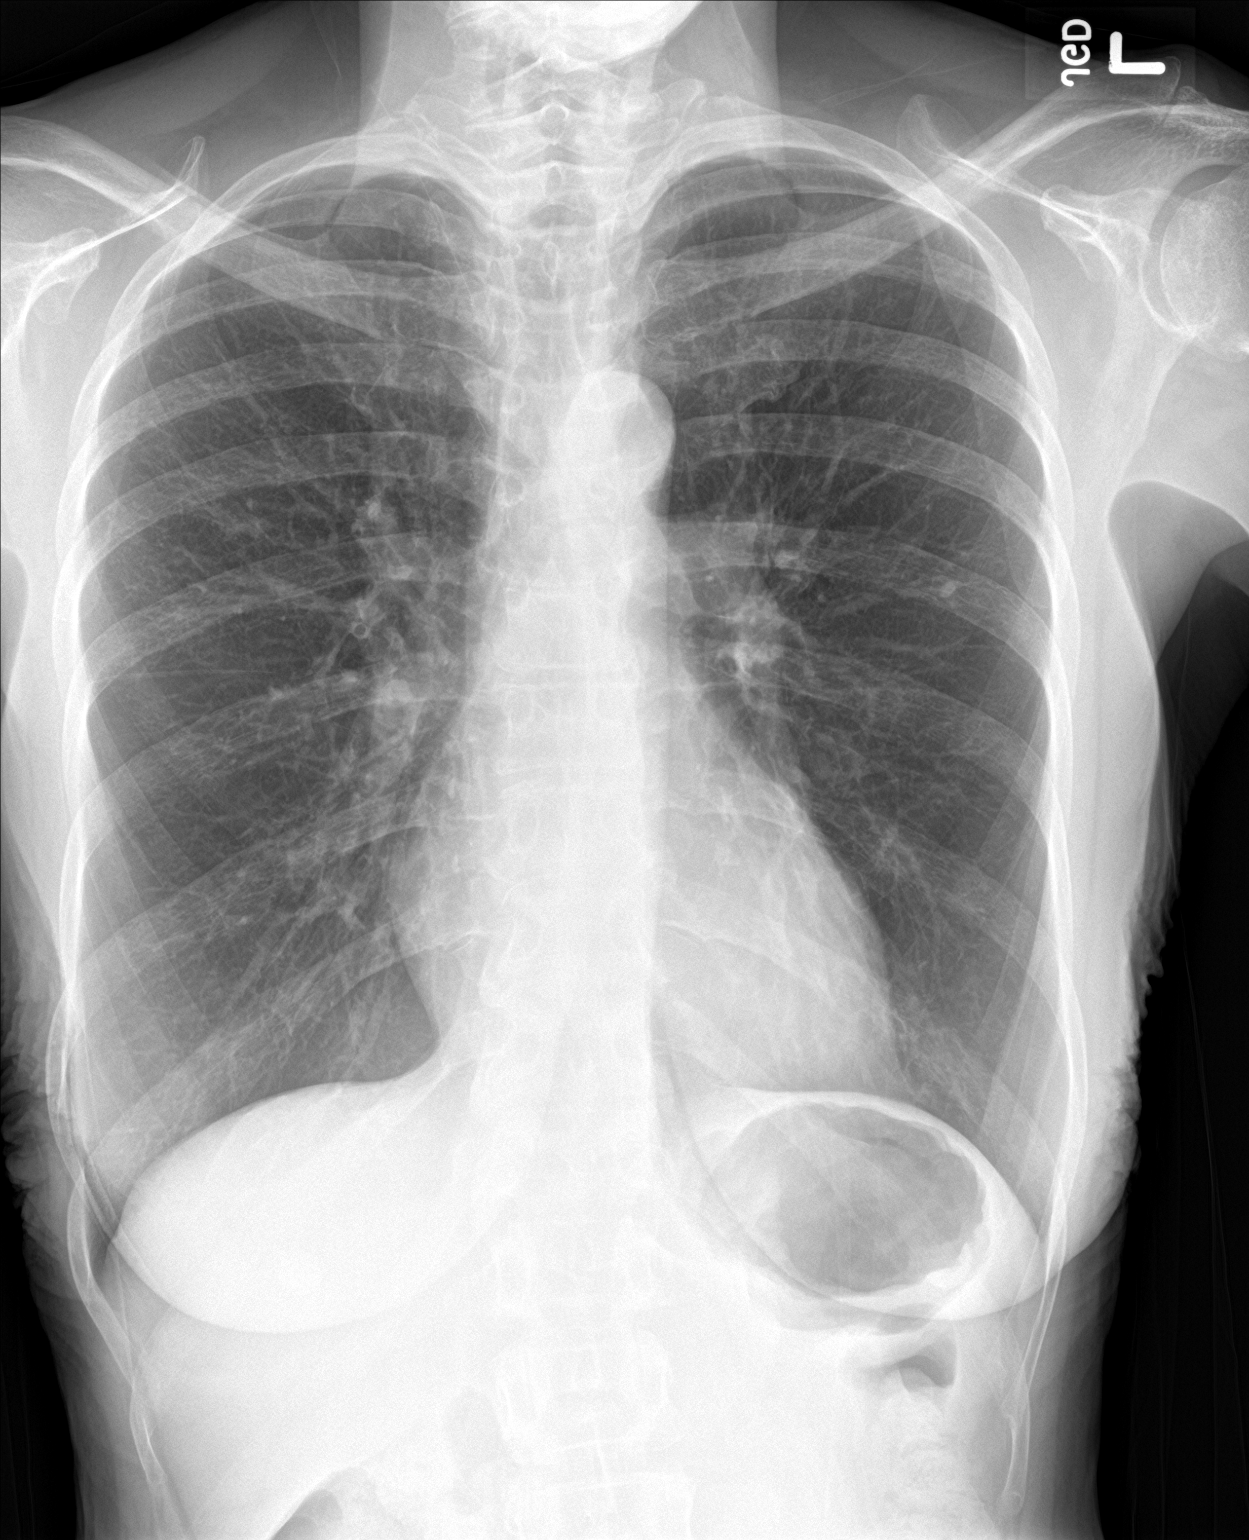

[chest lat]
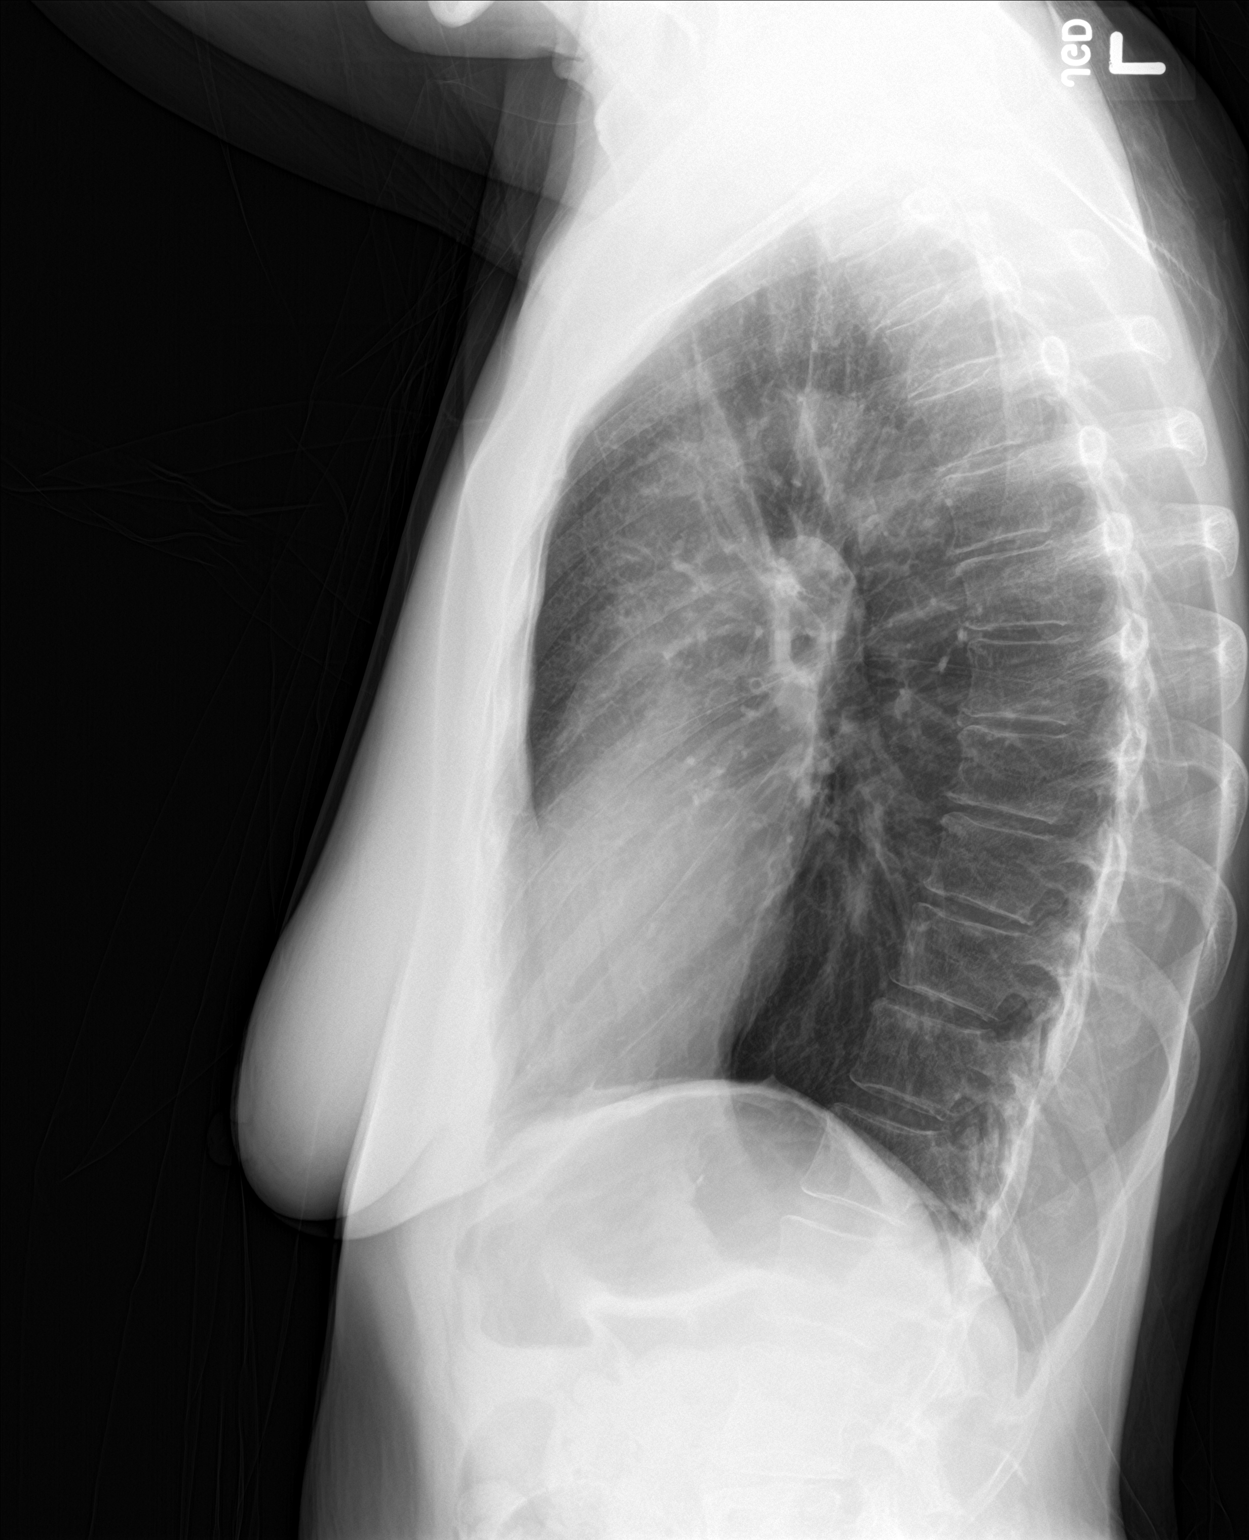

[2 of 2 positions shown; findings below may reference images not displayed]

FINDINGS: Heart size within normal limits. Hyperexpanded lungs. Stable left
upper lobe calcified granuloma. No focal airspace consolidation,
pleural effusion, or pneumothorax.
IMPRESSION: No active cardiopulmonary disease.

## 2020-05-17 MED ORDER — PREDNISONE 20 MG PO TABS: ORAL_TABLET | ORAL | 0 refills | Status: DC

## 2020-05-17 MED ORDER — PROMETHAZINE-DM 6.25-15 MG/5ML PO SYRP: 5.0000 mL | ORAL_SOLUTION | Freq: Every evening | ORAL | 0 refills | Status: DC | PRN

## 2020-05-17 NOTE — ED Triage Notes (Addendum)
Pt present SOB with cough and wheezing. Symptoms started two weeks ago. Pt has a  History of Asthma. Pt has been tried OTC and Prescribed medication with no relief. Pt states discomfort in the chest area.

## 2020-05-17 NOTE — ED Provider Notes (Signed)
[facility]   MRN: [id number] DOB: 24-Mar-1946  Subjective:   Heather Austin is a 75 y.o. female presenting for 2-week history of persistent cough, shortness of breath, wheezing, chest discomfort.  Patient actually went and saw her primary care provider this past week and was prescribed nebulized albuterol.  She has been using this but gets very temporary relief.  She is COVID vaccinated, has gotten her booster.  Denies smoking history.  She is using her albuterol inhalers.   No current facility-administered medications for this encounter.  Current Outpatient Medications:  .  albuterol (PROAIR HFA) 108 (90 Base) MCG/ACT inhaler, INHALE 2 PUFFS BY MOUTH EVERY 6 HOURS IF NEEDED, Disp: 8.5 g, Rfl: 6 .  amLODipine (NORVASC) 5 MG tablet, TK 2 TS PO QD, Disp: , Rfl: 1 .  atorvastatin (LIPITOR) 40 MG tablet, TK 1 T PO QD, Disp: , Rfl: 1 .  budesonide-formoterol (SYMBICORT) 160-4.5 MCG/ACT inhaler, Inhale 2 puffs then rinse mouth, twice daily, Disp: 1 Inhaler, Rfl: 12 .  Cranberry-Vitamin C-Probiotic (AZO CRANBERRY) 250-30 MG TABS, Take 1 tablet by mouth daily., Disp: , Rfl:  .  ferrous sulfate 325 (65 FE) MG tablet, Take 325 mg by mouth daily with breakfast., Disp: , Rfl:  .  hydrochlorothiazide (HYDRODIURIL) 25 MG tablet, , Disp: , Rfl:  .  metoprolol succinate (TOPROL-XL) 50 MG 24 hr tablet, Take 50 mg by mouth daily. Pt stopped taking 12/09/2018, Disp: , Rfl:  .  potassium chloride (K-DUR) 10 MEQ tablet, Take 10 mEq by mouth daily., Disp: , Rfl: 0 .  valsartan (DIOVAN) 320 MG tablet, Take 1 tablet by mouth daily., Disp: , Rfl:    Allergies  Allergen Reactions  . Latex Diarrhea and Rash         Past Medical History:  Diagnosis Date  . Asthma    Since age 24  . HTN (hypertension), benign   . Hyperlipidemia   . Multiple joint pain   . Rheumatoid arthritis [facility])      Past Surgical History:  Procedure Laterality Date  . CATARACT EXTRACTION Bilateral     Family  History  Problem Relation Age of Onset  . Hypertension Mother   . Asthma Sister   . Allergies Son   . Allergies Daughter     Social History   Tobacco Use  . Smoking status: Never Smoker  . Smokeless tobacco: Never Used  Substance Use Topics  . Alcohol use: No  . Drug use: No    ROS   Objective:   Vitals: BP (!) 147/89 (BP Location: Right Arm)   Pulse 94   Temp 97.7 F (36.5 C) (Oral)   Resp 20   SpO2 96%   Physical Exam Constitutional:      General: She is not in acute distress.    Appearance: Normal appearance. She is well-developed. She is not ill-appearing, toxic-appearing or diaphoretic.  HENT:     Head: Normocephalic and atraumatic.     Nose: Nose normal.     Mouth/Throat:     Mouth: Mucous membranes are moist.  Eyes:     Extraocular Movements: Extraocular movements intact.     Pupils: Pupils are equal, round, and reactive to light.  Cardiovascular:     Rate and Rhythm: Normal rate and regular rhythm.     Pulses: Normal pulses.     Heart sounds: Normal heart sounds. No murmur heard. No friction rub. No gallop.   Pulmonary:     Effort: Pulmonary  effort is normal. No respiratory distress.     Breath sounds: No stridor. Examination of the right-middle field reveals wheezing. Examination of the left-middle field reveals wheezing. Examination of the right-lower field reveals wheezing. Examination of the left-lower field reveals wheezing. Wheezing present. No decreased breath sounds, rhonchi or rales.     Comments: No cyanotic or pursed lips.  No use of accessory muscles for breathing. Skin:    General: Skin is warm and dry.     Findings: No rash.  Neurological:     Mental Status: She is alert and oriented to person, place, and time.  Psychiatric:        Mood and Affect: Mood normal.        Behavior: Behavior normal.        Thought Content: Thought content normal.     DG Chest 2 View  Result Date: 05/17/2020 CLINICAL DATA:  Cough, shortness of breath  EXAM: CHEST - 2 VIEW COMPARISON:  05/08/2018 FINDINGS: Heart size within normal limits. Hyperexpanded lungs. Stable left upper lobe calcified granuloma. No focal airspace consolidation, pleural effusion, or pneumothorax. IMPRESSION: No active cardiopulmonary disease. Electronically Signed   By: Davina Poke D.O.   On: 05/17/2020 16:48     Assessment and Plan :   PDMP not reviewed this encounter.  1. Cough   2. Shortness of breath   3. Atypical chest pain   4. Moderate persistent asthma without complication     PPIRJ-18 testing pending.  Recommended oral prednisone course in light of her asthma, physical exam findings.  Maintain regular inhaler, scheduled nebulized albuterol.  Follow-up closely with PCP.  Use cough suppression medication only at bedtime if necessary.  Counseled patient on potential for adverse effects with medications prescribed/recommended today, ER and return-to-clinic precautions discussed, patient verbalized understanding.    Jaynee Eagles, [place] 05/17/20 1709

## 2020-05-30 DIAGNOSIS — M0609 Rheumatoid arthritis without rheumatoid factor, multiple sites: Secondary | ICD-10-CM | POA: Diagnosis not present

## 2020-05-30 DIAGNOSIS — R937 Abnormal findings on diagnostic imaging of other parts of musculoskeletal system: Secondary | ICD-10-CM | POA: Diagnosis not present

## 2020-05-30 DIAGNOSIS — Z79899 Other long term (current) drug therapy: Secondary | ICD-10-CM | POA: Diagnosis not present

## 2020-06-09 DIAGNOSIS — I10 Essential (primary) hypertension: Secondary | ICD-10-CM | POA: Diagnosis not present

## 2020-06-11 ENCOUNTER — Other Ambulatory Visit: Payer: Self-pay | Admitting: Physician Assistant

## 2020-06-11 DIAGNOSIS — R5381 Other malaise: Secondary | ICD-10-CM

## 2020-06-18 ENCOUNTER — Other Ambulatory Visit: Payer: Self-pay | Admitting: Physician Assistant

## 2020-06-18 DIAGNOSIS — M810 Age-related osteoporosis without current pathological fracture: Secondary | ICD-10-CM

## 2020-07-09 ENCOUNTER — Other Ambulatory Visit (HOSPITAL_COMMUNITY): Payer: Self-pay | Admitting: Gastroenterology

## 2020-07-09 ENCOUNTER — Other Ambulatory Visit: Payer: Self-pay | Admitting: Gastroenterology

## 2020-07-09 ENCOUNTER — Other Ambulatory Visit (HOSPITAL_BASED_OUTPATIENT_CLINIC_OR_DEPARTMENT_OTHER): Payer: Self-pay | Admitting: Gastroenterology

## 2020-07-09 DIAGNOSIS — R634 Abnormal weight loss: Secondary | ICD-10-CM | POA: Diagnosis not present

## 2020-07-09 DIAGNOSIS — R1084 Generalized abdominal pain: Secondary | ICD-10-CM | POA: Diagnosis not present

## 2020-07-16 ENCOUNTER — Other Ambulatory Visit: Payer: Self-pay

## 2020-07-16 ENCOUNTER — Ambulatory Visit (HOSPITAL_BASED_OUTPATIENT_CLINIC_OR_DEPARTMENT_OTHER)
Admission: RE | Admit: 2020-07-16 | Discharge: 2020-07-16 | Disposition: A | Discharge status: Home / Self Care | Payer: Medicare Other | Source: Ambulatory Visit | Attending: Gastroenterology | Admitting: Gastroenterology

## 2020-07-16 DIAGNOSIS — R935 Abnormal findings on diagnostic imaging of other abdominal regions, including retroperitoneum: Secondary | ICD-10-CM | POA: Insufficient documentation

## 2020-07-16 DIAGNOSIS — D259 Leiomyoma of uterus, unspecified: Secondary | ICD-10-CM | POA: Insufficient documentation

## 2020-07-16 DIAGNOSIS — R1084 Generalized abdominal pain: Secondary | ICD-10-CM | POA: Diagnosis not present

## 2020-07-16 DIAGNOSIS — R634 Abnormal weight loss: Secondary | ICD-10-CM

## 2020-07-16 DIAGNOSIS — R109 Unspecified abdominal pain: Secondary | ICD-10-CM | POA: Diagnosis not present

## 2020-07-16 LAB — POCT I-STAT CREATININE: Creatinine, Ser: 1.1 mg/dL — ABNORMAL HIGH (ref 0.44–1.00)

## 2020-07-16 IMAGING — CT CT ABD-PELV W/ CM
1 of 3 series · 13 of 32 positions shown, 18 images · IV contrast (APPLIED)
Comparison: None.

CLINICAL DATA: Generalized abdominal pain for couple of weeks

EXAM:
CT ABDOMEN AND PELVIS WITH CONTRAST
TECHNIQUE: Multidetector CT imaging of the abdomen and pelvis was performed
using the standard protocol following bolus administration of
intravenous contrast.
CONTRAST:  80mL OMNIPAQUE IOHEXOL 300 MG/ML SOLN, additional oral
enteric contrast

[Series 2: abd pel w · axial · 0.59mm/px · z∈[+839,+1239]mm · 13 of 90 slices shown, 18 images]
[im 5/90  soft-tissue]
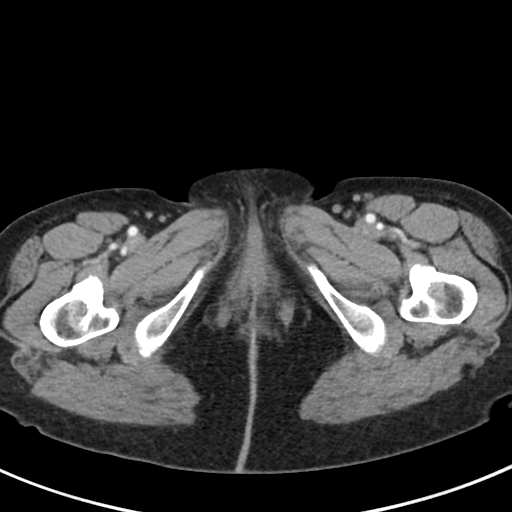
[im 5/90  bone]
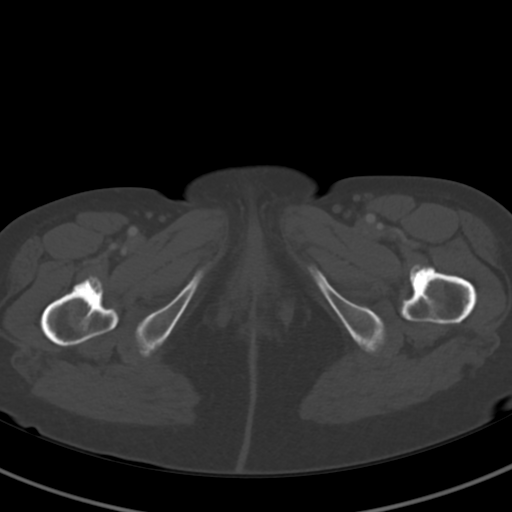
[im 14/90  soft-tissue]
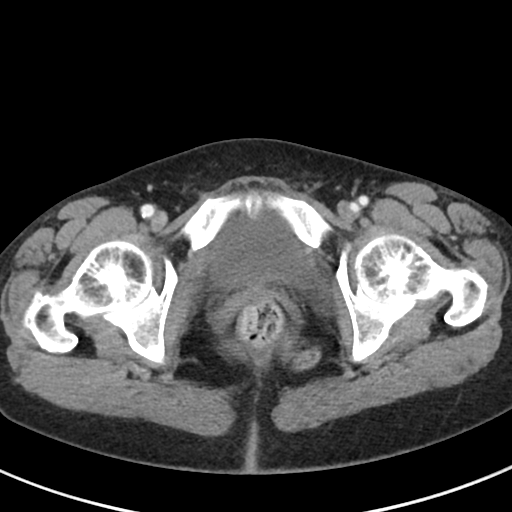
[im 18/90  soft-tissue]
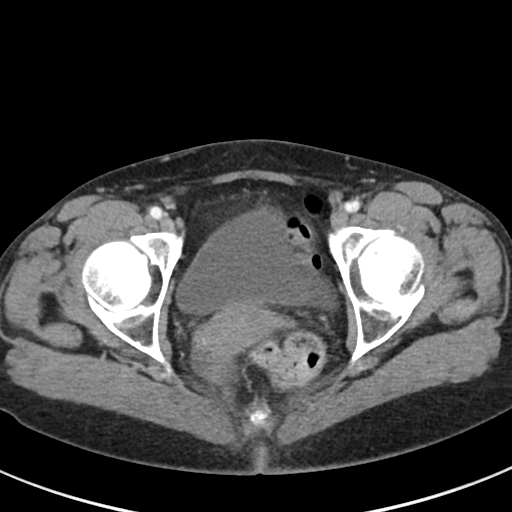
[im 27/90  soft-tissue]
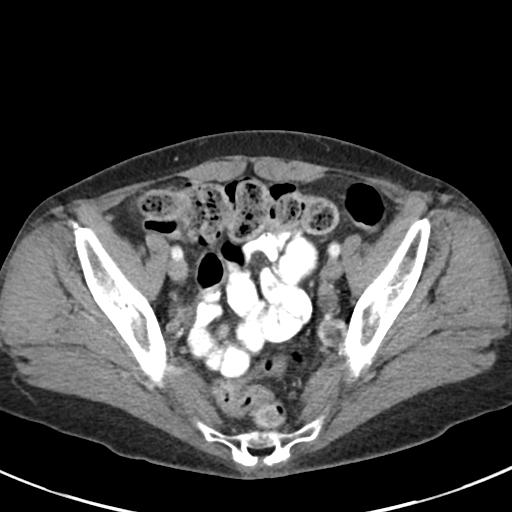
[im 36/90  soft-tissue]
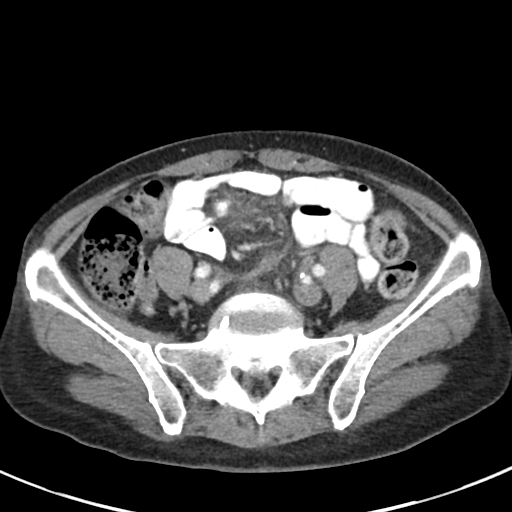
[im 41/90  soft-tissue]
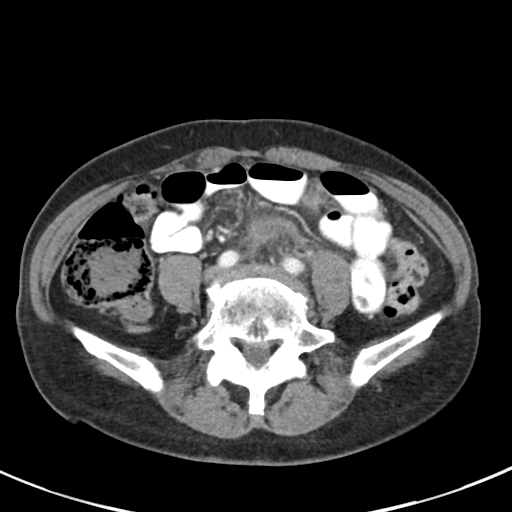
[im 49/90  soft-tissue]
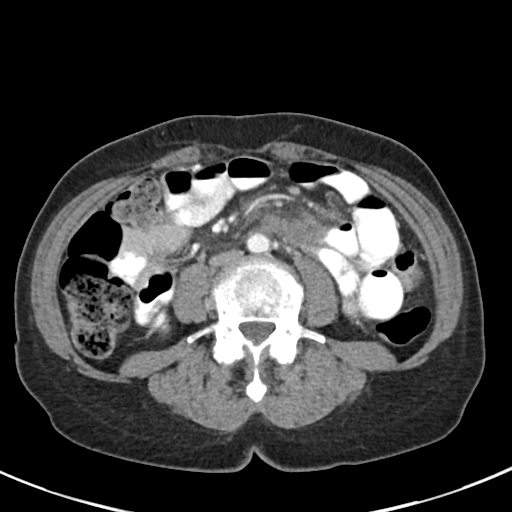
[im 54/90  soft-tissue]
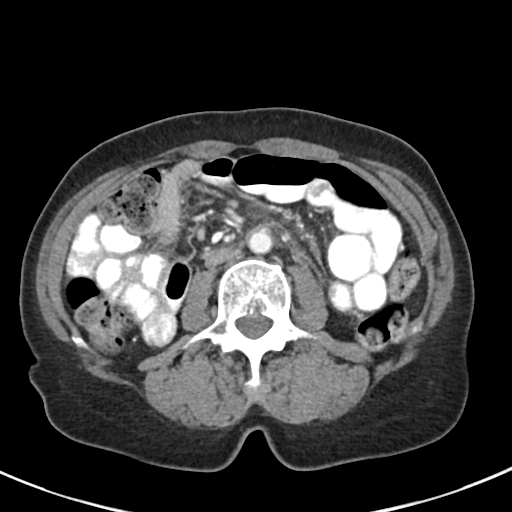
[im 63/90  soft-tissue]
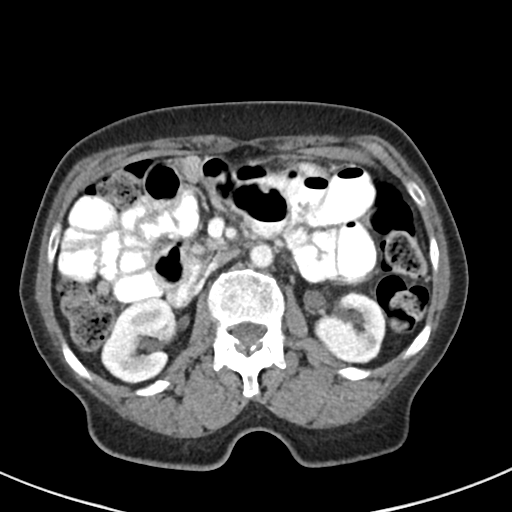
[im 63/90  bone]
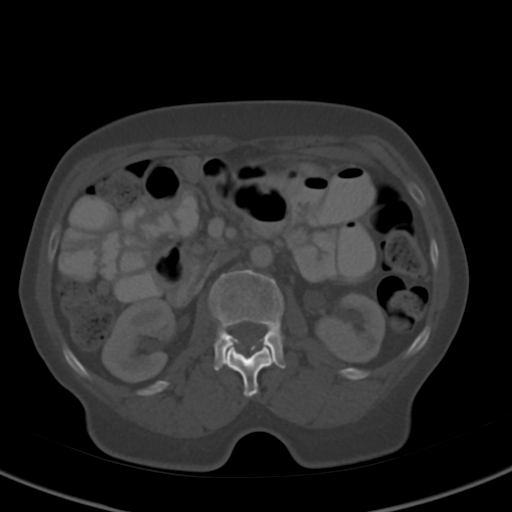
[im 72/90  soft-tissue]
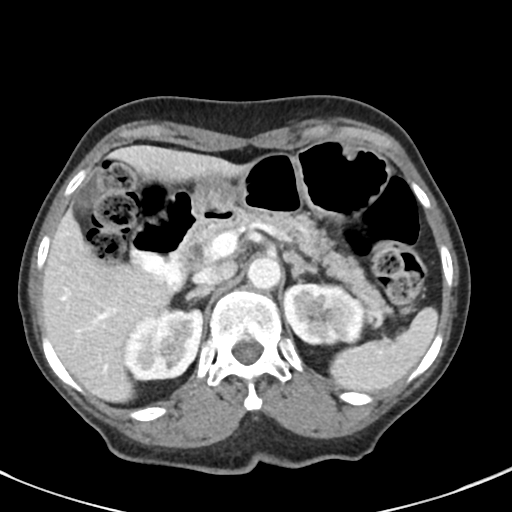
[im 72/90  lung]
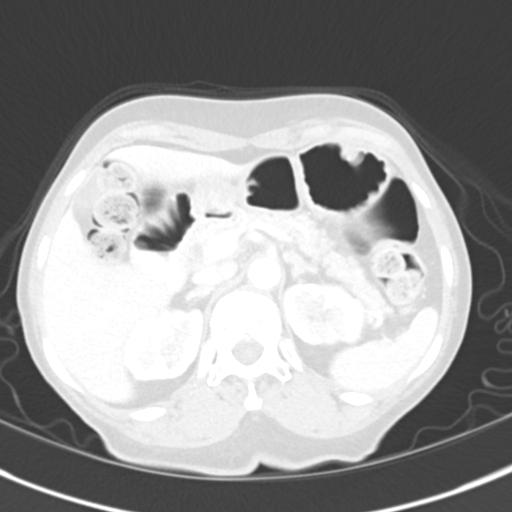
[im 76/90  soft-tissue]
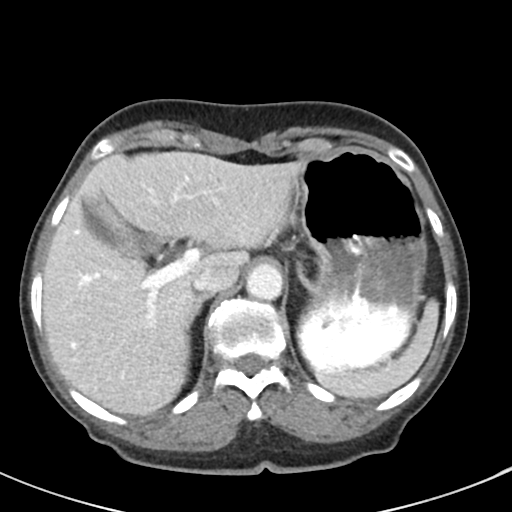
[im 76/90  lung]
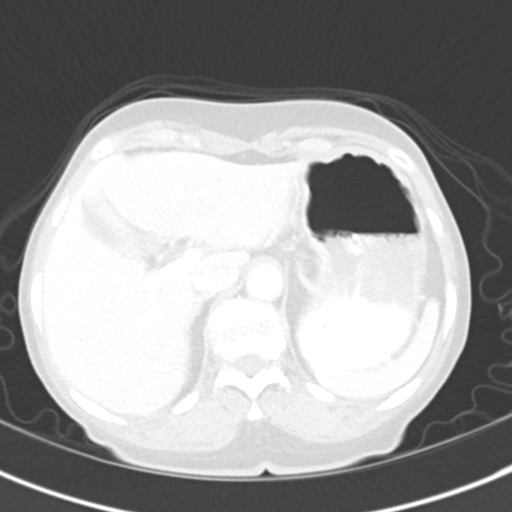
[im 81/90  lung]
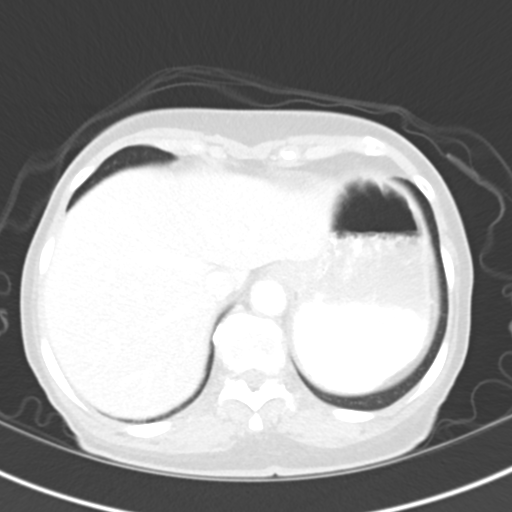
[im 85/90  soft-tissue]
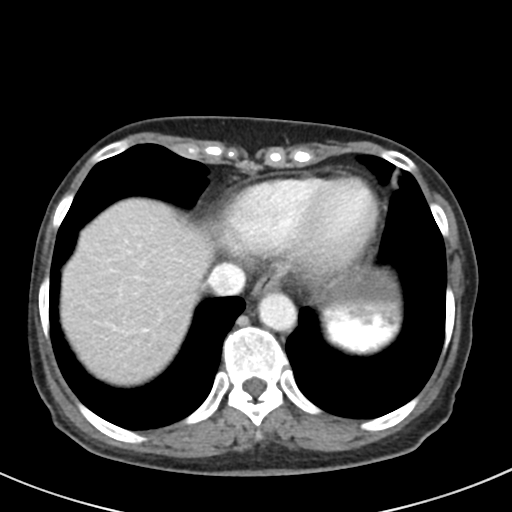
[im 85/90  lung]
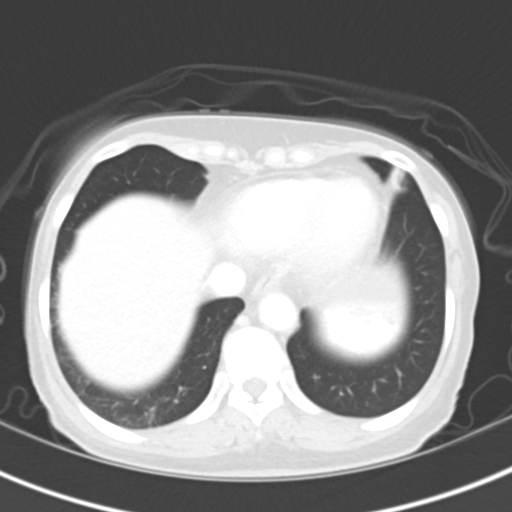

[13 of 32 positions shown; findings below may reference images not displayed]

FINDINGS: Lower chest: No acute abnormality.

Hepatobiliary: No solid liver abnormality is seen. No gallstones,
gallbladder wall thickening, or biliary dilatation.

Pancreas: Unremarkable. No pancreatic ductal dilatation or
surrounding inflammatory changes.

Spleen: Normal in size without significant abnormality.

Adrenals/Urinary Tract: Adrenal glands are unremarkable. Kidneys are
normal, without renal calculi, solid lesion, or hydronephrosis.
Bladder is unremarkable.

Stomach/Bowel: Stomach is within normal limits. Appendix appears
normal. No evidence of bowel wall thickening or distention. There is
irregular fat stranding and fluid in the central small bowel
mesentery (series 2, image 52, series 5, image 55). The dominant,
central component measures approximately 2.7 x 2.1 x 2.1 cm.
Moderate burden of stool throughout the colon and rectum.

Vascular/Lymphatic: No significant vascular findings are present. No
enlarged abdominal or pelvic lymph nodes.

Reproductive: Calcified uterine fibroids.

Other: No abdominal wall hernia or abnormality. No abdominopelvic
ascites.

Musculoskeletal: No acute or significant osseous findings.
IMPRESSION: 1. There is irregular fat stranding and fluid in the central small
bowel mesentery. The dominant, central component measures
approximately 2.7 x 2.1 x 2.1 cm. This is of uncertain nature,
suspicious for phlegmon or abscess, however without obvious etiology
of the adjacent bowel such as diverticula. Carcinoid metastases or
lymphoma could likewise have this appearance, albeit somewhat
unusual. Contrast enhanced MRI or PET-CT may be helpful to further
evaluate.
2. Moderate burden of stool throughout the colon and rectum.
3. Calcified uterine fibroids.

These results will be called to the ordering clinician or
representative by the Radiologist Assistant, and communication
documented in the PACS or [REDACTED].

## 2020-07-16 MED ORDER — IOHEXOL 300 MG/ML  SOLN
80.0000 mL | Freq: Once | INTRAMUSCULAR | Status: AC | PRN
  Administered 2020-07-16: 80 mL via INTRAVENOUS
  Filled 2020-07-16: qty 80

## 2020-07-21 ENCOUNTER — Other Ambulatory Visit: Payer: Self-pay | Admitting: Gastroenterology

## 2020-07-21 DIAGNOSIS — R1084 Generalized abdominal pain: Secondary | ICD-10-CM

## 2020-07-21 DIAGNOSIS — R935 Abnormal findings on diagnostic imaging of other abdominal regions, including retroperitoneum: Secondary | ICD-10-CM

## 2020-07-21 DIAGNOSIS — I10 Essential (primary) hypertension: Secondary | ICD-10-CM | POA: Diagnosis not present

## 2020-07-29 DIAGNOSIS — Z7984 Long term (current) use of oral hypoglycemic drugs: Secondary | ICD-10-CM | POA: Diagnosis not present

## 2020-07-29 DIAGNOSIS — H524 Presbyopia: Secondary | ICD-10-CM | POA: Diagnosis not present

## 2020-07-29 DIAGNOSIS — E119 Type 2 diabetes mellitus without complications: Secondary | ICD-10-CM | POA: Diagnosis not present

## 2020-07-29 DIAGNOSIS — Z961 Presence of intraocular lens: Secondary | ICD-10-CM | POA: Diagnosis not present

## 2020-08-09 ENCOUNTER — Ambulatory Visit
Admission: RE | Admit: 2020-08-09 | Discharge: 2020-08-09 | Disposition: A | Discharge status: Home / Self Care | Payer: Medicare Other | Source: Ambulatory Visit | Attending: Gastroenterology | Admitting: Gastroenterology

## 2020-08-09 ENCOUNTER — Other Ambulatory Visit: Payer: Self-pay

## 2020-08-09 DIAGNOSIS — R1084 Generalized abdominal pain: Secondary | ICD-10-CM

## 2020-08-09 DIAGNOSIS — R935 Abnormal findings on diagnostic imaging of other abdominal regions, including retroperitoneum: Secondary | ICD-10-CM

## 2020-08-09 DIAGNOSIS — N281 Cyst of kidney, acquired: Secondary | ICD-10-CM | POA: Diagnosis not present

## 2020-08-09 IMAGING — MR MR ABDOMEN WO/W CM
16 of 24 series · 31 of 48 positions shown · IV contrast (11ml Multihance)
Comparison: CT scan [DATE]

CLINICAL DATA: Abdominal and pelvic discomfort. Abnormal mesenteric
process on CT

EXAM:
MRI ABDOMEN WITHOUT AND WITH CONTRAST
TECHNIQUE: Multiplanar multisequence MR imaging of the abdomen was performed
both before and after the administration of intravenous contrast.
CONTRAST:  11mL MULTIHANCE GADOBENATE DIMEGLUMINE 529 MG/ML IV SOLN

[Series 7: cor haste · coronal · 5.0mm · 0.70mm/px · 2 of 35 slices shown]
[im 1/35]
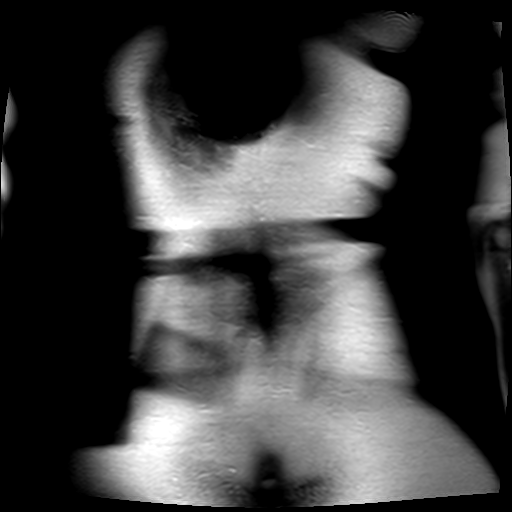
[im 35/35]
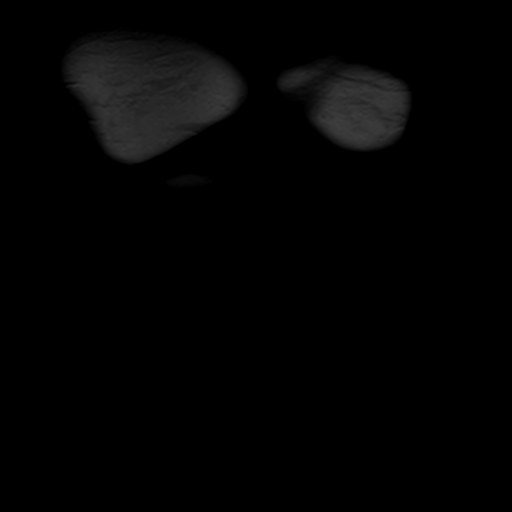

[Series 9: T1 · axial · 6.0mm · 0.66mm/px · z∈[-15,+222]mm · 3 of 74 slices shown]
[im 1/74]
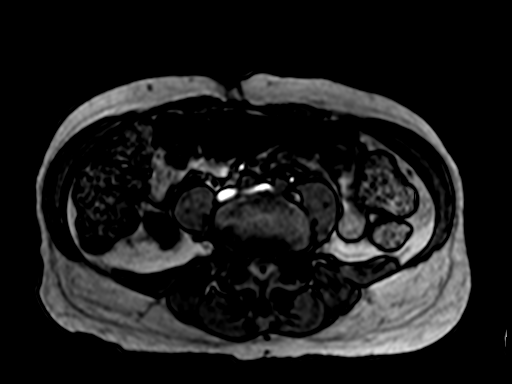
[im 37/74]
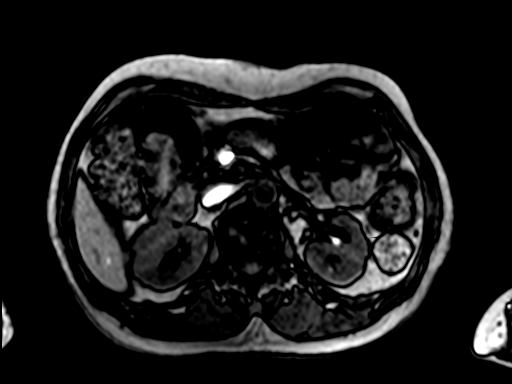
[im 74/74]
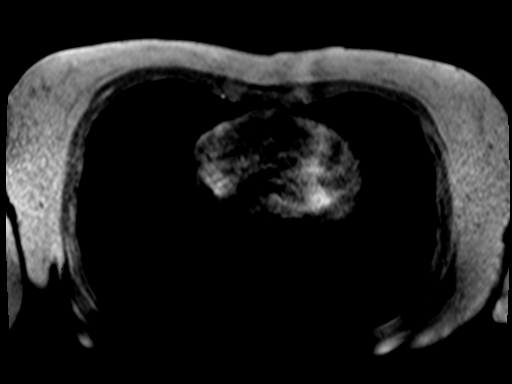

[Series 10: axial haste repeat · axial · 6.0mm · 0.66mm/px · z∈[-15,+222]mm · 2 of 37 slices shown]
[im 1/37]
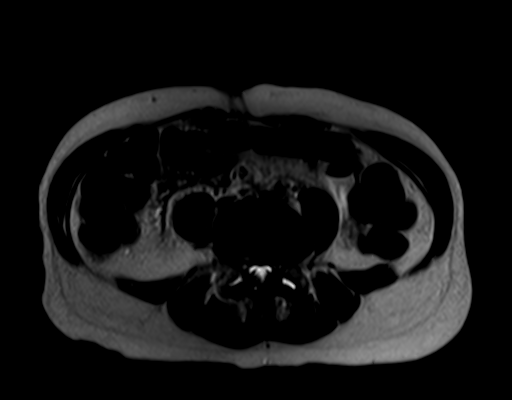
[im 37/37]
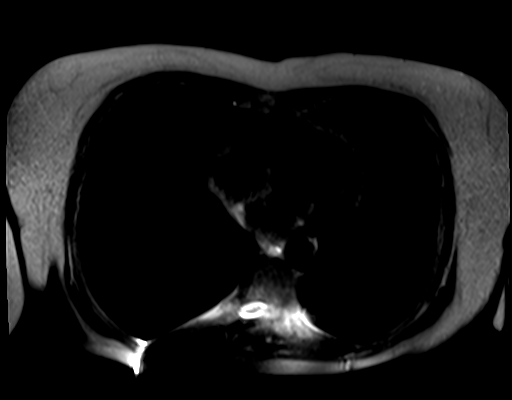

[Series 12: T2 fat-sat · axial · 6.0mm · 1.33mm/px · z∈[-15,+222]mm · 2 of 37 slices shown]
[im 1/37]
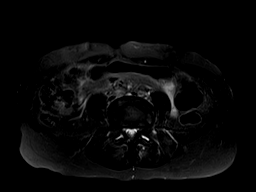
[im 37/37]
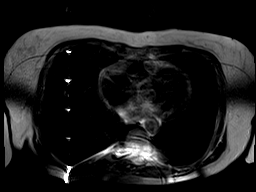

[Series 17: ep2d_diff_b50_500_800_p2_trig · axial · 6.0mm · 1.67mm/px · z∈[-15,+222]mm · 3 of 105 slices shown (1 of 2)]
[im 1/105]
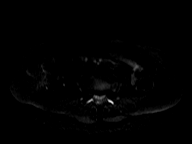
[im 53/105]
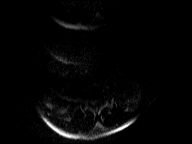
[im 105/105]
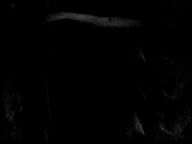

[Series 18: ep2d_diff_b50_500_800_p2_trig_adc · axial · 6.0mm · 1.67mm/px · z∈[-15,+222]mm · 2 of 37 slices shown (1 of 2)]
[im 1/37]
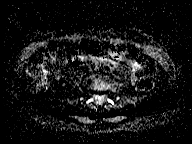
[im 37/37]
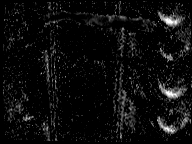

[Series 19: bSSFP · axial · 4.0mm · 0.64mm/px · z∈[-17,+227]mm · 2 of 62 slices shown (1 of 2)]
[im 1/62]
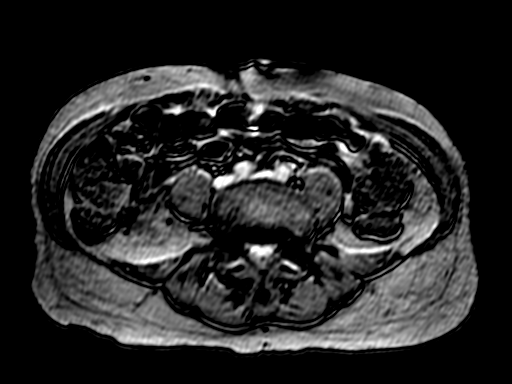
[im 62/62]
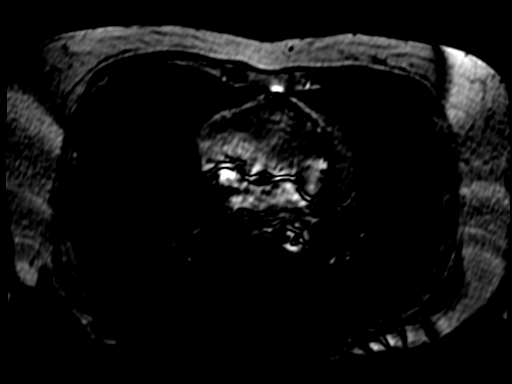

[Series 23: ep2d_diff_b50_500_800_p2_trig · axial · 6.0mm · 1.77mm/px · z∈[-110,+93]mm · 2 of 96 slices shown (2 of 2)]
[im 1/96]
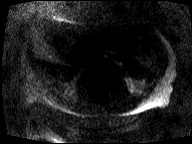
[im 96/96]
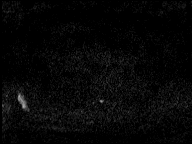

[Series 24: ep2d_diff_b50_500_800_p2_trig_adc · axial · 6.0mm · 1.77mm/px · 1 of 32 slices shown (2 of 2)]
[im 1/32]
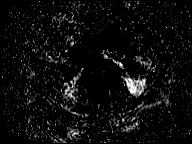

[Series 25: bSSFP · axial · 4.0mm · 0.64mm/px · 1 of 62 slices shown (2 of 2)]
[im 1/62]
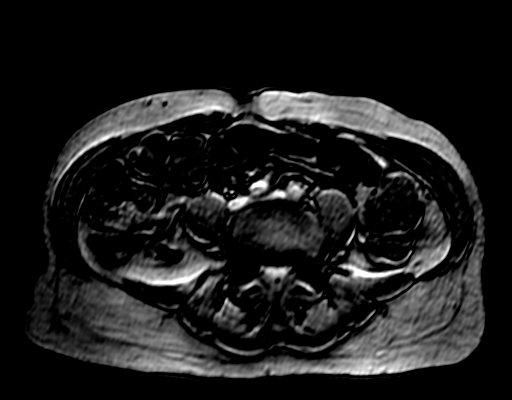

[Series 26: T1 dynamic · axial · non-contrast · 2.5mm · 0.66mm/px · z∈[-169,+88]mm · 2 of 104 slices shown]
[im 1/104]
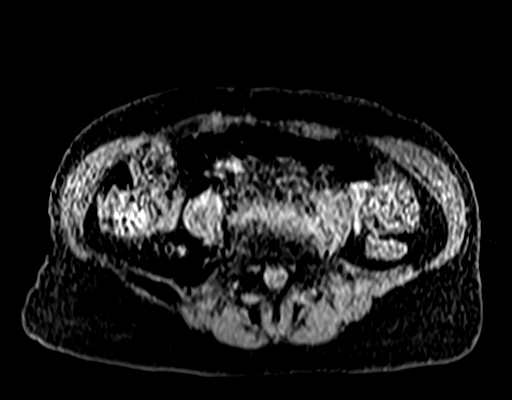
[im 104/104]
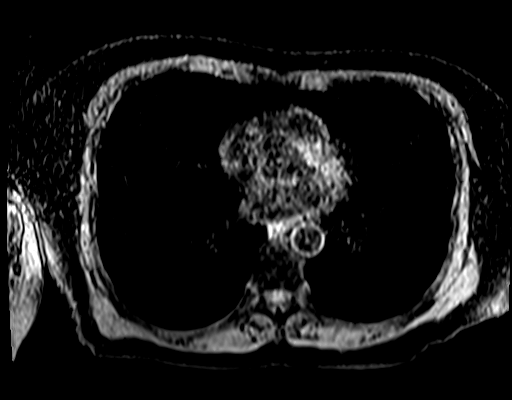

[Series 27: T1 dynamic post-contrast · axial · 2.5mm · 0.66mm/px · z∈[-169,+88]mm · 2 of 104 slices shown (1 of 5)]
[im 1/104]
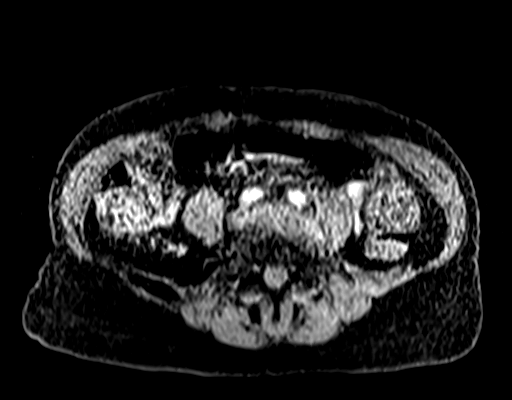
[im 104/104]
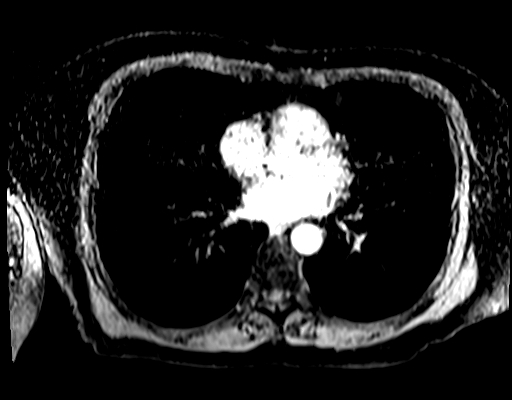

[Series 28: T1 dynamic post-contrast · axial · 2.5mm · 0.66mm/px · z∈[-169,+88]mm · 2 of 104 slices shown (2 of 5)]
[im 1/104]
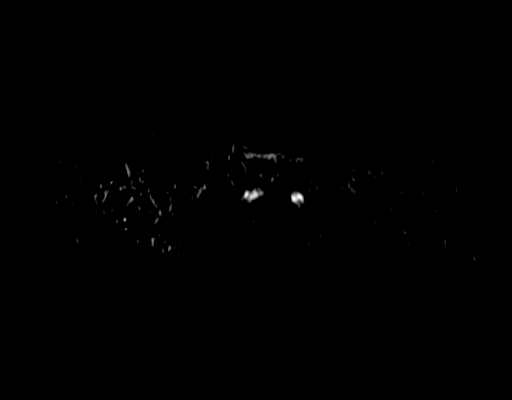
[im 104/104]
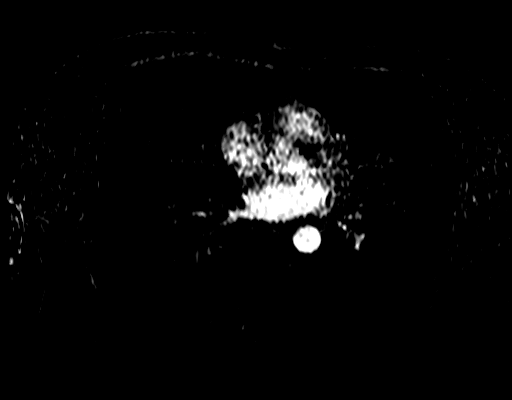

[Series 29: T1 dynamic post-contrast · axial · 2.5mm · 0.66mm/px · z∈[-169,+88]mm · 2 of 104 slices shown (3 of 5)]
[im 1/104]
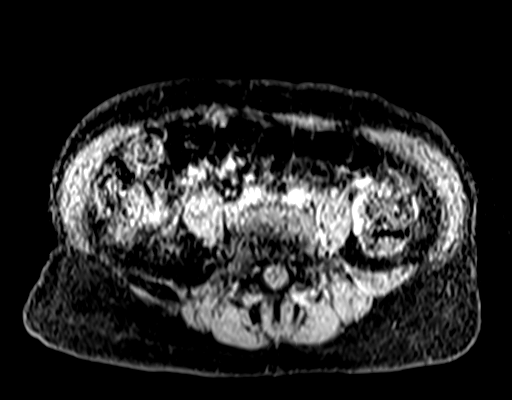
[im 104/104]
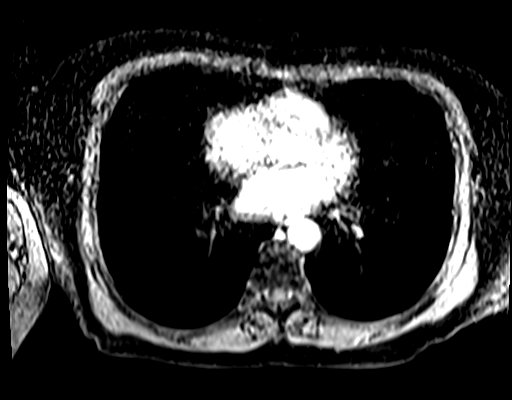

[Series 30: T1 dynamic post-contrast · axial · 2.5mm · 0.66mm/px · z∈[-169,+88]mm · 2 of 104 slices shown (4 of 5)]
[im 1/104]
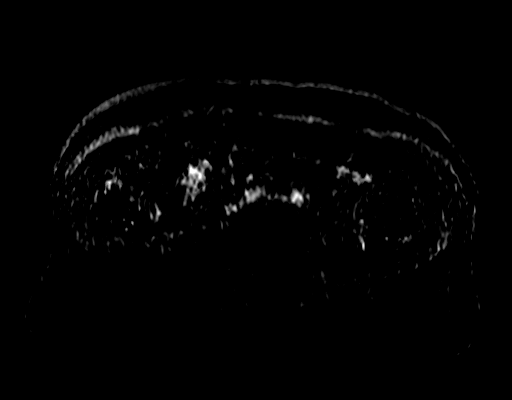
[im 104/104]
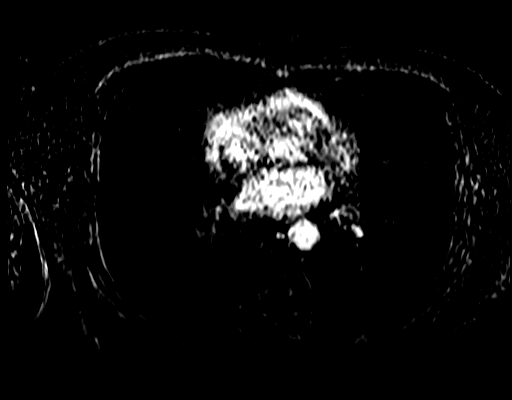

[Series 31: T1 dynamic post-contrast · axial · 2.5mm · 0.66mm/px · 1 of 104 slices shown (5 of 5)]
[im 1/104]
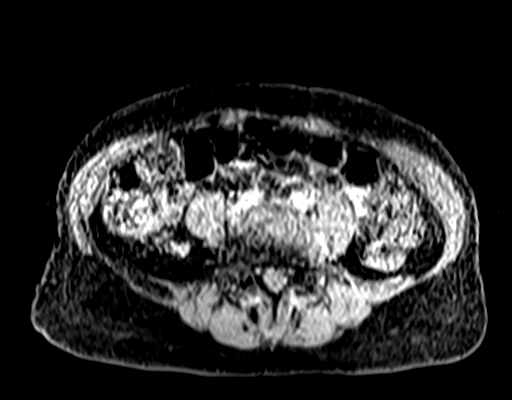

[31 of 48 positions shown; findings below may reference images not displayed]

FINDINGS: Despite efforts by the technologist and patient, motion artifact is
present on today's exam and could not be eliminated. This reduces
exam sensitivity and specificity.

Lower chest: Unremarkable

Hepatobiliary: Unremarkable

Pancreas:  Unremarkable

Spleen:  Unremarkable

Adrenals/Urinary Tract: Both adrenal glands appear normal. Small
left renal cysts noted.

Stomach/Bowel: Prominent stool throughout the colon favors
constipation.

Vascular/Lymphatic:  Unremarkable

Other: The mesenteric infiltrative process is substantially less
readily apparent on today's MRI compared to previous CT. I am
uncertain whether this represents an improvement or simply reduce
conspicuity on MRI. On T2 fat saturated images there seems to be
some low-grade edema in the central mesentery on image 25 of series
12, although less conspicuous compared to prior CT.

Musculoskeletal: Mild lower lumbar spondylosis and degenerative disc
disease.
IMPRESSION: 1. Reduced conspicuity of the central mesenteric edema in the upper
abdomen compared to prior CT. There is still some low-grade
infiltrative edema present, but I not see a discrete mass in the
upper abdomen. Possibilities may include sclerosing mesenteritis,
mesenteric lymphoma, idiopathic mesenteric stranding, inflammatory
bowel disease, mesenteric thrombosis, or cirrhosis. However, we do
not demonstrate other findings of mesenteric thrombosis or cirrhosis
in this case. CT surveillance in the next 1-3 months time to ensure
resolution may be warranted.
2.  Prominent stool throughout the colon favors constipation.

## 2020-08-09 IMAGING — MR MR PELVIS WO/W CM
7 series · 43 of 48 positions shown · IV contrast (multihance)
Comparison: CT pelvis [DATE]

CLINICAL DATA: Abnormal mesenteric stranding on recent CT, with
abdominal pain

EXAM:
MRI PELVIS WITHOUT AND WITH CONTRAST
TECHNIQUE: Multiplanar multisequence MR imaging of the pelvis was performed
both before and after administration of intravenous contrast.
CONTRAST:  11mL MULTIHANCE GADOBENATE DIMEGLUMINE 529 MG/ML IV SOLN

[Series 4: cor haste · coronal · 6.0mm · 0.94mm/px · 7 of 28 slices shown]
[im 1/28]
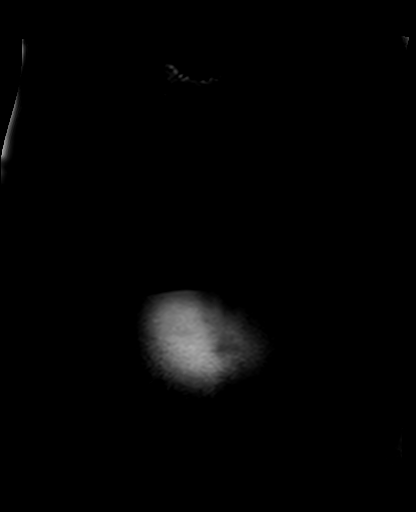
[im 5/28]
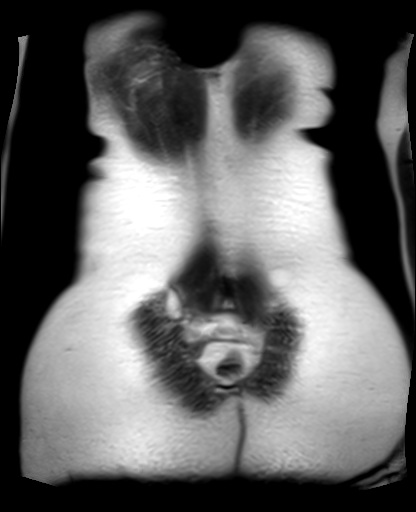
[im 10/28]
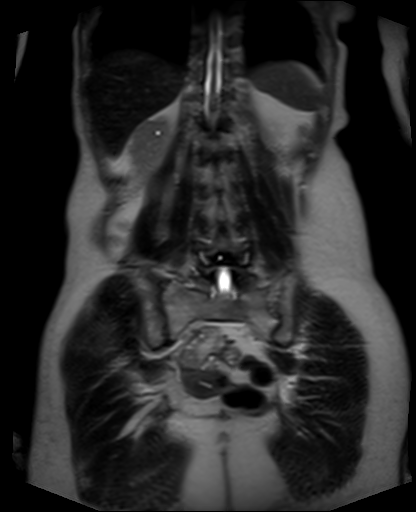
[im 14/28]
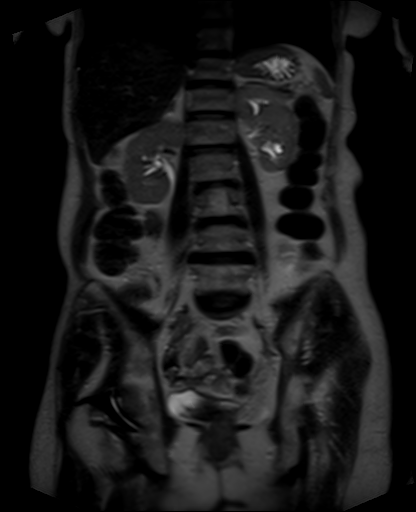
[im 19/28]
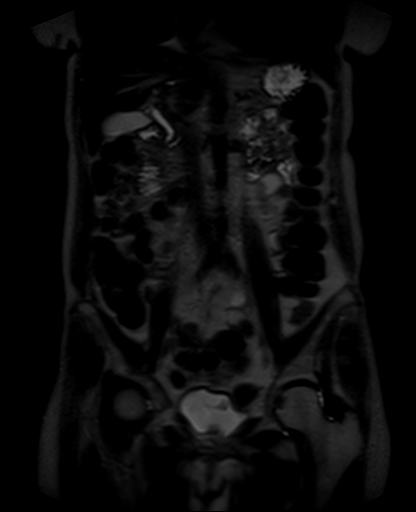
[im 23/28]
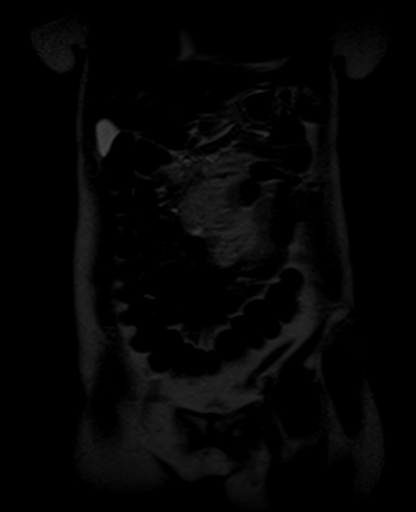
[im 28/28]
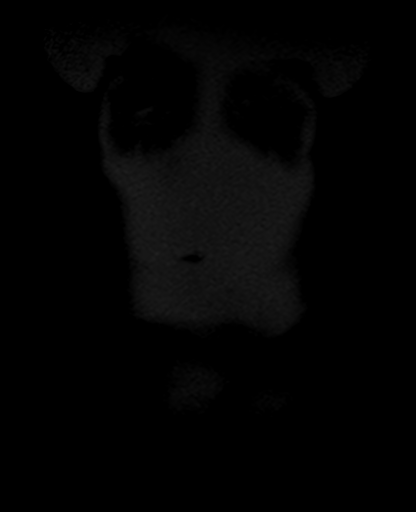

[Series 5: t2_tse_sag · sagittal · 5.0mm · 1.09mm/px · 11 of 56 slices shown]
[im 1/56]
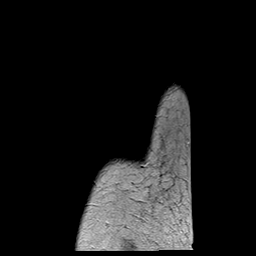
[im 6/56]
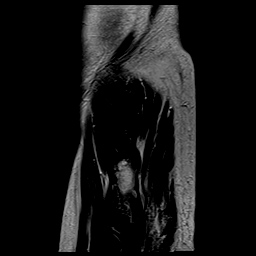
[im 12/56]
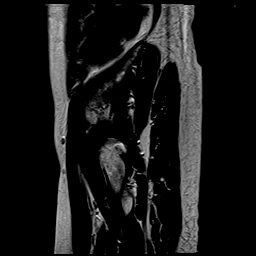
[im 17/56]
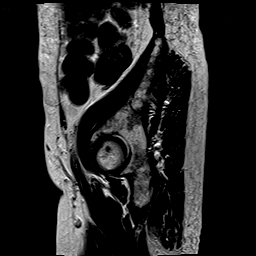
[im 23/56]
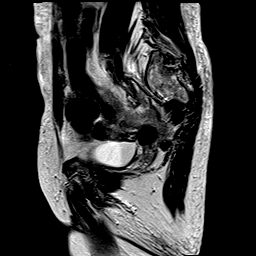
[im 28/56]
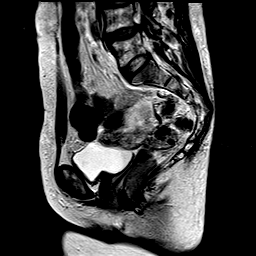
[im 34/56]
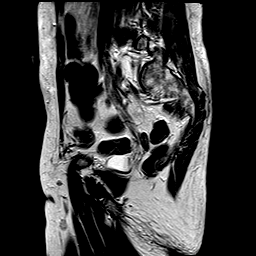
[im 39/56]
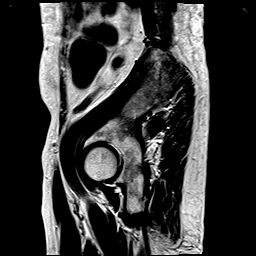
[im 45/56]
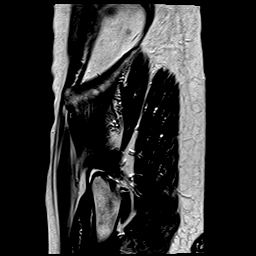
[im 50/56]
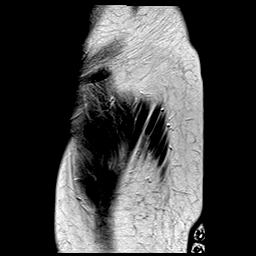
[im 56/56]
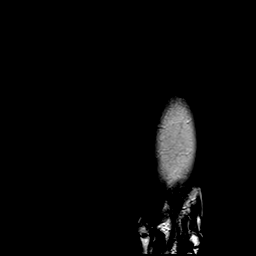

[Series 6: t2_tse axial · axial · 7.0mm · 1.41mm/px · z∈[-270,+11]mm · 6 of 32 slices shown]
[im 1/32]
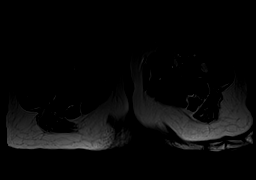
[im 7/32]
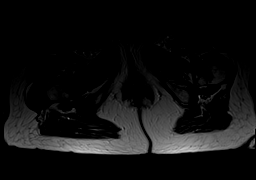
[im 13/32]
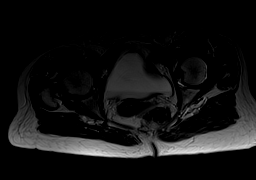
[im 19/32]
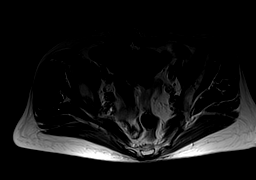
[im 25/32]
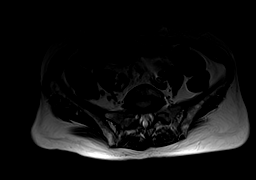
[im 32/32]
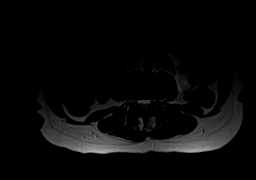

[Series 7: t2_tse axial fs · axial · 7.0mm · 1.41mm/px · z∈[-270,+11]mm · 6 of 32 slices shown]
[im 1/32]
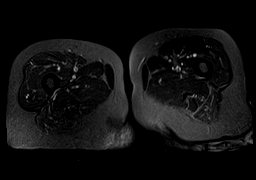
[im 7/32]
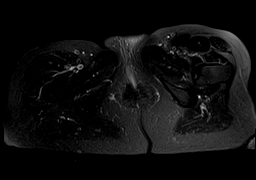
[im 13/32]
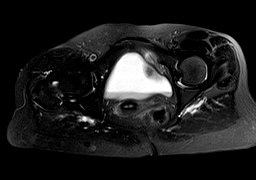
[im 19/32]
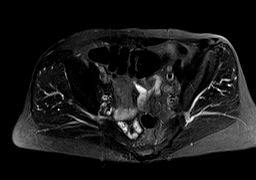
[im 25/32]
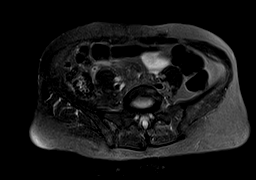
[im 32/32]
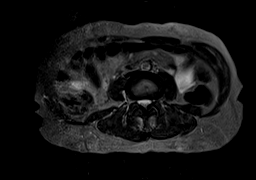

[Series 8: axial spgr · axial · 7.0mm · 1.41mm/px · z∈[-270,+12]mm · 6 of 32 slices shown]
[im 1/32]
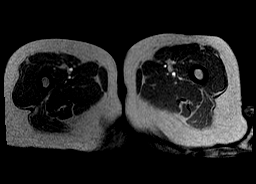
[im 7/32]
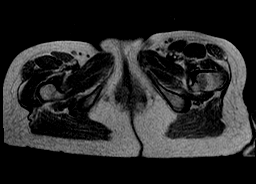
[im 13/32]
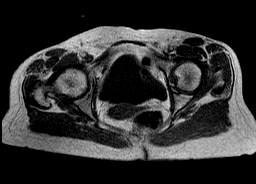
[im 19/32]
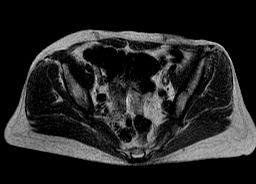
[im 25/32]
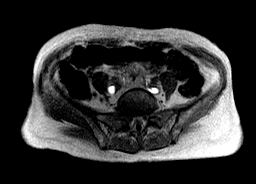
[im 32/32]
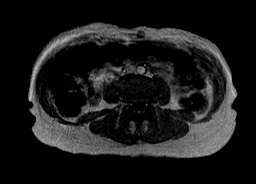

[Series 9: axial spgr pre · axial · non-contrast · 7.0mm · 0.70mm/px · z∈[-270,+12]mm · 6 of 32 slices shown]
[im 1/32]
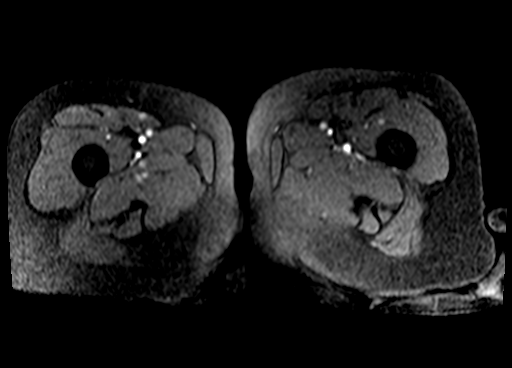
[im 7/32]
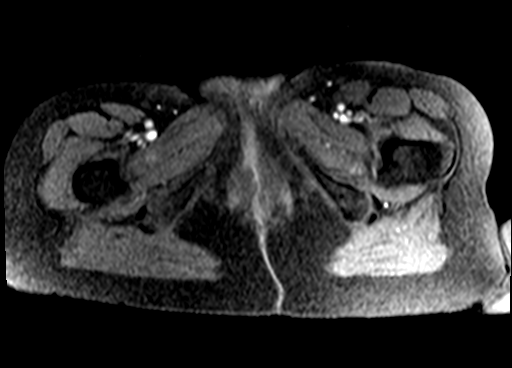
[im 13/32]
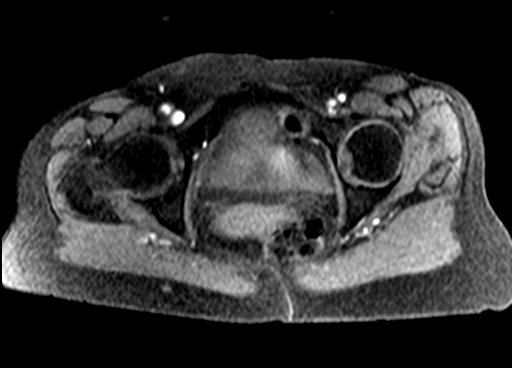
[im 19/32]
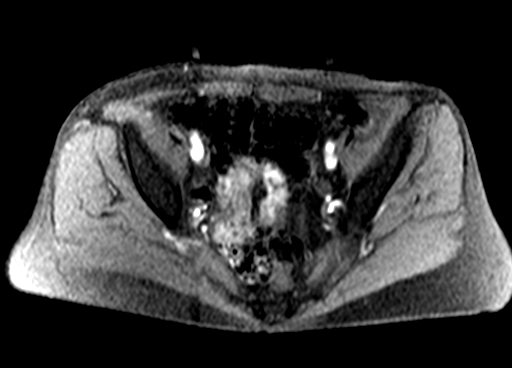
[im 25/32]
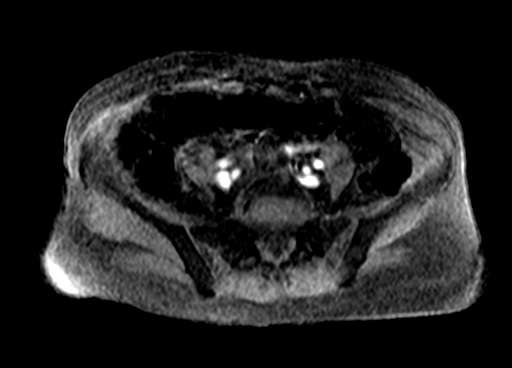
[im 32/32]
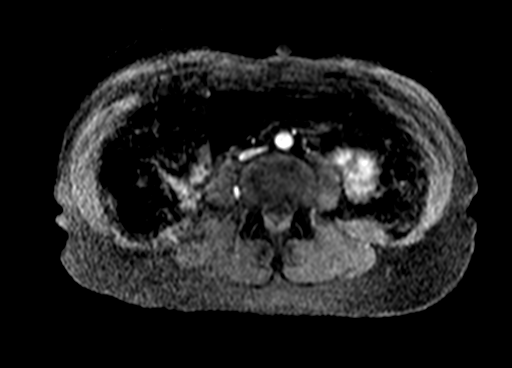

[Series 10: axial spgr post · axial · 7.0mm · 0.70mm/px · 1 of 32 slices shown]
[im 1/32]
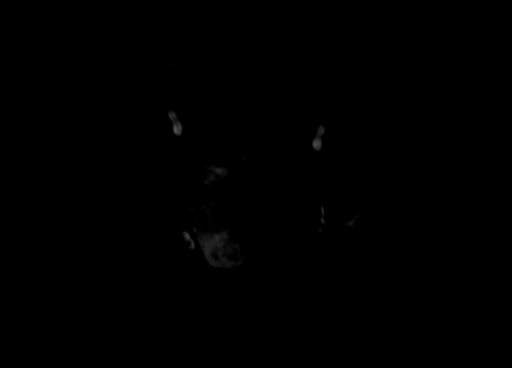

[43 of 48 positions shown; findings below may reference images not displayed]

FINDINGS: Urinary Tract: Fluid-level in the urinary bladder could represent
debris. No distal hydroureter.

Bowel:  Prominent stool throughout the colon favors constipation.

Vascular/Lymphatic: Unremarkable

Reproductive: 2.5 by 2.2 by 2.8 cm intramural fibroid in the
anterior uterine body. Additional smaller fibroids are also present.
Endometrial thickness 0.3 cm, within normal limits. No adnexal mass.

Other: As with the CT of the upper abdomen, there is reduce
conspicuity of the central mesenteric stranding on MRI compared to
CT. I am uncertain whether this represents substantial improvement
versus simply being less conspicuous on MRI. Subject to limitations
of the substantial motion artifact, I not see an obvious enhancing
mesenteric mass. No ascites.

Musculoskeletal: Disc bulge and facet arthropathy at L4-5.
IMPRESSION: 1. Reduced conspicuity of the central mesenteric stranding seen on
CT. This probably represents improvement although could possibly
simply be from reduce conspicuity of the process on MRI is compared
to CT. There is some motion artifact on today's MRI which adversely
affects assessment. Overall the appearance of stranding in the
mesentery is fairly nonspecific with possibilities including
sclerosing mesenteritis, mesenteric lymphoma, idiopathic stranding,
cirrhosis, acute adjacent inflammation, or mesenteric venous
thrombosis. Given the suggestion of improvement, I would suggest
follow up CT in 1-3 months time in order to ensure resolution.
2. Uterine fibroids.
3. Potential debris in the urinary bladder, correlate with urine
analysis.
4.  Prominent stool throughout the colon favors constipation.

## 2020-08-09 MED ORDER — GADOBENATE DIMEGLUMINE 529 MG/ML IV SOLN
11.0000 mL | Freq: Once | INTRAVENOUS | Status: AC | PRN
  Administered 2020-08-09: 11 mL via INTRAVENOUS

## 2020-08-12 DIAGNOSIS — R945 Abnormal results of liver function studies: Secondary | ICD-10-CM | POA: Diagnosis not present

## 2020-08-28 DIAGNOSIS — R945 Abnormal results of liver function studies: Secondary | ICD-10-CM | POA: Diagnosis not present

## 2020-09-01 DIAGNOSIS — R7989 Other specified abnormal findings of blood chemistry: Secondary | ICD-10-CM | POA: Diagnosis not present

## 2020-09-01 DIAGNOSIS — Z6821 Body mass index (BMI) 21.0-21.9, adult: Secondary | ICD-10-CM | POA: Diagnosis not present

## 2020-09-01 DIAGNOSIS — M810 Age-related osteoporosis without current pathological fracture: Secondary | ICD-10-CM | POA: Diagnosis not present

## 2020-09-01 DIAGNOSIS — Z79899 Other long term (current) drug therapy: Secondary | ICD-10-CM | POA: Diagnosis not present

## 2020-09-01 DIAGNOSIS — M0609 Rheumatoid arthritis without rheumatoid factor, multiple sites: Secondary | ICD-10-CM | POA: Diagnosis not present

## 2020-09-01 DIAGNOSIS — R937 Abnormal findings on diagnostic imaging of other parts of musculoskeletal system: Secondary | ICD-10-CM | POA: Diagnosis not present

## 2020-09-11 DIAGNOSIS — J449 Chronic obstructive pulmonary disease, unspecified: Secondary | ICD-10-CM | POA: Diagnosis not present

## 2020-09-30 ENCOUNTER — Inpatient Hospital Stay (HOSPITAL_COMMUNITY)
Admission: EM | Admit: 2020-09-30 | Discharge: 2020-10-02 | DRG: 202 | Disposition: A | Discharge status: Home Health | Payer: Medicare Other | Attending: Family Medicine | Admitting: Family Medicine

## 2020-09-30 ENCOUNTER — Encounter (HOSPITAL_COMMUNITY): Payer: Self-pay | Admitting: Emergency Medicine

## 2020-09-30 ENCOUNTER — Emergency Department (HOSPITAL_COMMUNITY): Payer: Medicare Other

## 2020-09-30 ENCOUNTER — Other Ambulatory Visit: Payer: Self-pay

## 2020-09-30 DIAGNOSIS — I10 Essential (primary) hypertension: Secondary | ICD-10-CM

## 2020-09-30 DIAGNOSIS — J80 Acute respiratory distress syndrome: Secondary | ICD-10-CM | POA: Diagnosis present

## 2020-09-30 DIAGNOSIS — M069 Rheumatoid arthritis, unspecified: Secondary | ICD-10-CM | POA: Diagnosis present

## 2020-09-30 DIAGNOSIS — E785 Hyperlipidemia, unspecified: Secondary | ICD-10-CM | POA: Diagnosis not present

## 2020-09-30 DIAGNOSIS — E1165 Type 2 diabetes mellitus with hyperglycemia: Secondary | ICD-10-CM | POA: Diagnosis present

## 2020-09-30 DIAGNOSIS — I499 Cardiac arrhythmia, unspecified: Secondary | ICD-10-CM | POA: Diagnosis not present

## 2020-09-30 DIAGNOSIS — Z91040 Latex allergy status: Secondary | ICD-10-CM | POA: Diagnosis not present

## 2020-09-30 DIAGNOSIS — Z79899 Other long term (current) drug therapy: Secondary | ICD-10-CM | POA: Diagnosis not present

## 2020-09-30 DIAGNOSIS — E876 Hypokalemia: Secondary | ICD-10-CM | POA: Diagnosis not present

## 2020-09-30 DIAGNOSIS — Z20822 Contact with and (suspected) exposure to covid-19: Secondary | ICD-10-CM | POA: Diagnosis present

## 2020-09-30 DIAGNOSIS — J9602 Acute respiratory failure with hypercapnia: Secondary | ICD-10-CM | POA: Diagnosis not present

## 2020-09-30 DIAGNOSIS — E872 Acidosis: Secondary | ICD-10-CM | POA: Diagnosis not present

## 2020-09-30 DIAGNOSIS — T380X5A Adverse effect of glucocorticoids and synthetic analogues, initial encounter: Secondary | ICD-10-CM | POA: Diagnosis present

## 2020-09-30 DIAGNOSIS — J45901 Unspecified asthma with (acute) exacerbation: Secondary | ICD-10-CM | POA: Diagnosis not present

## 2020-09-30 DIAGNOSIS — I129 Hypertensive chronic kidney disease with stage 1 through stage 4 chronic kidney disease, or unspecified chronic kidney disease: Secondary | ICD-10-CM | POA: Diagnosis present

## 2020-09-30 DIAGNOSIS — R739 Hyperglycemia, unspecified: Secondary | ICD-10-CM

## 2020-09-30 DIAGNOSIS — D72829 Elevated white blood cell count, unspecified: Secondary | ICD-10-CM | POA: Diagnosis present

## 2020-09-30 DIAGNOSIS — E1122 Type 2 diabetes mellitus with diabetic chronic kidney disease: Secondary | ICD-10-CM | POA: Diagnosis present

## 2020-09-30 DIAGNOSIS — Z8249 Family history of ischemic heart disease and other diseases of the circulatory system: Secondary | ICD-10-CM

## 2020-09-30 DIAGNOSIS — Z825 Family history of asthma and other chronic lower respiratory diseases: Secondary | ICD-10-CM | POA: Diagnosis not present

## 2020-09-30 DIAGNOSIS — R911 Solitary pulmonary nodule: Secondary | ICD-10-CM | POA: Diagnosis not present

## 2020-09-30 DIAGNOSIS — R6889 Other general symptoms and signs: Secondary | ICD-10-CM | POA: Diagnosis not present

## 2020-09-30 DIAGNOSIS — R0689 Other abnormalities of breathing: Secondary | ICD-10-CM | POA: Diagnosis not present

## 2020-09-30 DIAGNOSIS — Z7951 Long term (current) use of inhaled steroids: Secondary | ICD-10-CM | POA: Diagnosis not present

## 2020-09-30 DIAGNOSIS — J9600 Acute respiratory failure, unspecified whether with hypoxia or hypercapnia: Secondary | ICD-10-CM | POA: Diagnosis not present

## 2020-09-30 DIAGNOSIS — R0603 Acute respiratory distress: Secondary | ICD-10-CM

## 2020-09-30 DIAGNOSIS — N1832 Chronic kidney disease, stage 3b: Secondary | ICD-10-CM | POA: Diagnosis not present

## 2020-09-30 DIAGNOSIS — N183 Chronic kidney disease, stage 3 unspecified: Secondary | ICD-10-CM

## 2020-09-30 DIAGNOSIS — Z743 Need for continuous supervision: Secondary | ICD-10-CM | POA: Diagnosis not present

## 2020-09-30 DIAGNOSIS — R0602 Shortness of breath: Secondary | ICD-10-CM | POA: Diagnosis not present

## 2020-09-30 DIAGNOSIS — N1831 Chronic kidney disease, stage 3a: Secondary | ICD-10-CM

## 2020-09-30 LAB — BLOOD GAS, VENOUS
Acid-base deficit: 4.6 mmol/L — ABNORMAL HIGH (ref 0.0–2.0)
Acid-base deficit: 4.1 mmol/L — ABNORMAL HIGH (ref 0.0–2.0)
Bicarbonate: 24.5 mmol/L (ref 20.0–28.0)
Bicarbonate: 22.9 mmol/L (ref 20.0–28.0)
FIO2: 40
O2 Saturation: 93.1 %
O2 Saturation: 45.8 %
Patient temperature: 99.8
Patient temperature: 98.6
pCO2, Ven: 68.6 mmHg — ABNORMAL HIGH (ref 44.0–60.0)
pCO2, Ven: 53.3 mmHg (ref 44.0–60.0)
pH, Ven: 7.184 — CL (ref 7.250–7.430)
pH, Ven: 7.257 (ref 7.250–7.430)
pO2, Ven: 91.3 mmHg — ABNORMAL HIGH (ref 32.0–45.0)
pO2, Ven: 31.8 mmHg — CL (ref 32.0–45.0)

## 2020-09-30 LAB — CBC WITH DIFFERENTIAL/PLATELET
Abs Immature Granulocytes: 0.08 10*3/uL — ABNORMAL HIGH (ref 0.00–0.07)
Basophils Absolute: 0.1 10*3/uL (ref 0.0–0.1)
Basophils Relative: 0 %
Eosinophils Absolute: 0.4 10*3/uL (ref 0.0–0.5)
Eosinophils Relative: 2 %
HCT: 39.6 % (ref 36.0–46.0)
Hemoglobin: 12.9 g/dL (ref 12.0–15.0)
Immature Granulocytes: 1 %
Lymphocytes Relative: 25 %
Lymphs Abs: 3.8 10*3/uL (ref 0.7–4.0)
MCH: 31.6 pg (ref 26.0–34.0)
MCHC: 32.6 g/dL (ref 30.0–36.0)
MCV: 97.1 fL (ref 80.0–100.0)
Monocytes Absolute: 0.6 10*3/uL (ref 0.1–1.0)
Monocytes Relative: 4 %
Neutro Abs: 10.2 10*3/uL — ABNORMAL HIGH (ref 1.7–7.7)
Neutrophils Relative %: 68 %
Platelets: 213 10*3/uL (ref 150–400)
RBC: 4.08 MIL/uL (ref 3.87–5.11)
RDW: 13 % (ref 11.5–15.5)
WBC: 15 10*3/uL — ABNORMAL HIGH (ref 4.0–10.5)
nRBC: 0 % (ref 0.0–0.2)

## 2020-09-30 LAB — GLUCOSE, CAPILLARY
Glucose-Capillary: 286 mg/dL — ABNORMAL HIGH (ref 70–99)
Glucose-Capillary: 253 mg/dL — ABNORMAL HIGH (ref 70–99)

## 2020-09-30 LAB — BASIC METABOLIC PANEL
Anion gap: 11 (ref 5–15)
BUN: 30 mg/dL — ABNORMAL HIGH (ref 8–23)
CO2: 24 mmol/L (ref 22–32)
Calcium: 9.4 mg/dL (ref 8.9–10.3)
Chloride: 108 mmol/L (ref 98–111)
Creatinine, Ser: 1.27 mg/dL — ABNORMAL HIGH (ref 0.44–1.00)
GFR, Estimated: 44 mL/min — ABNORMAL LOW (ref >60)
Glucose, Bld: 222 mg/dL — ABNORMAL HIGH (ref 70–99)
Potassium: 3.3 mmol/L — ABNORMAL LOW (ref 3.5–5.1)
Sodium: 143 mmol/L (ref 135–145)

## 2020-09-30 LAB — RESP PANEL BY RT-PCR (FLU A&B, COVID) ARPGX2
Influenza A by PCR: NEGATIVE
Influenza B by PCR: NEGATIVE
SARS Coronavirus 2 by RT PCR: NEGATIVE

## 2020-09-30 LAB — CBG MONITORING, ED: Glucose-Capillary: 244 mg/dL — ABNORMAL HIGH (ref 70–99)

## 2020-09-30 IMAGING — DX DG CHEST 1V PORT
1 series · 2 of 2 positions shown · non-contrast
Comparison: [DATE]

CLINICAL DATA: Respiratory distress

EXAM:
PORTABLE CHEST 1 VIEW

[Series 1: chest ap · 0.14mm/px · 2 of 2 slices shown]
[im 1/2]
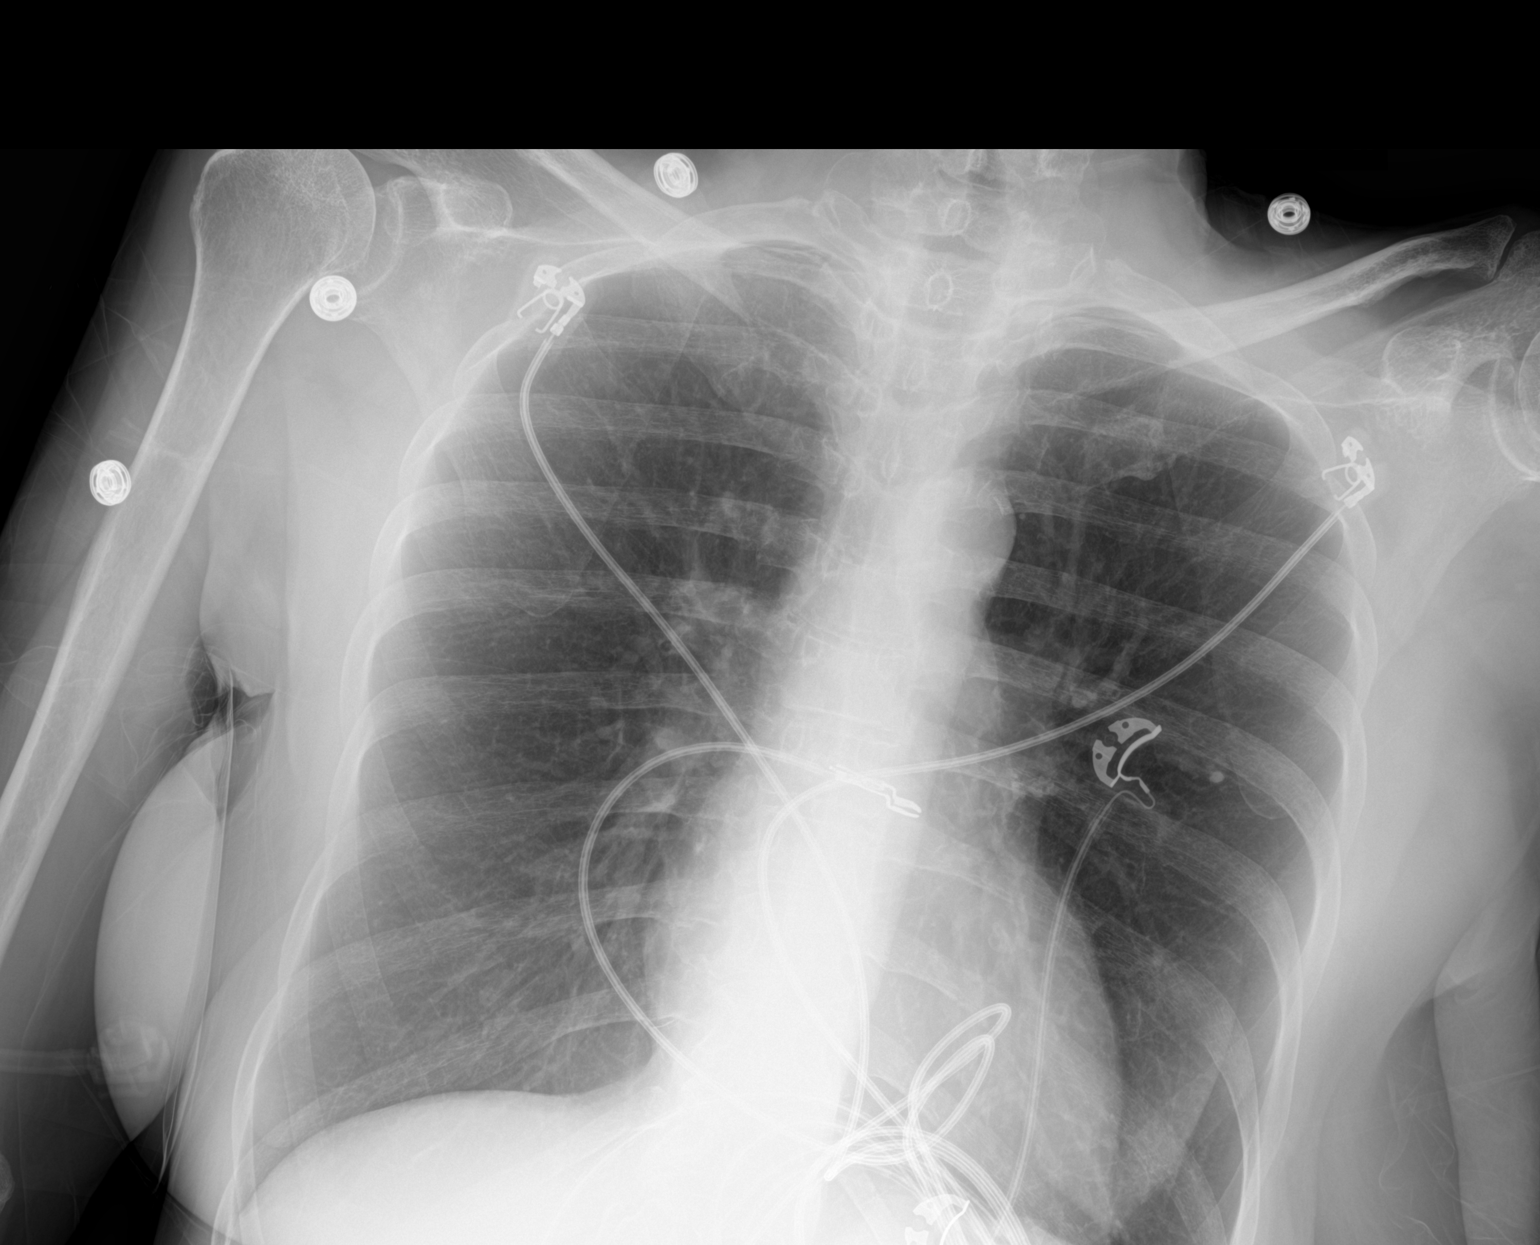
[im 2/2]
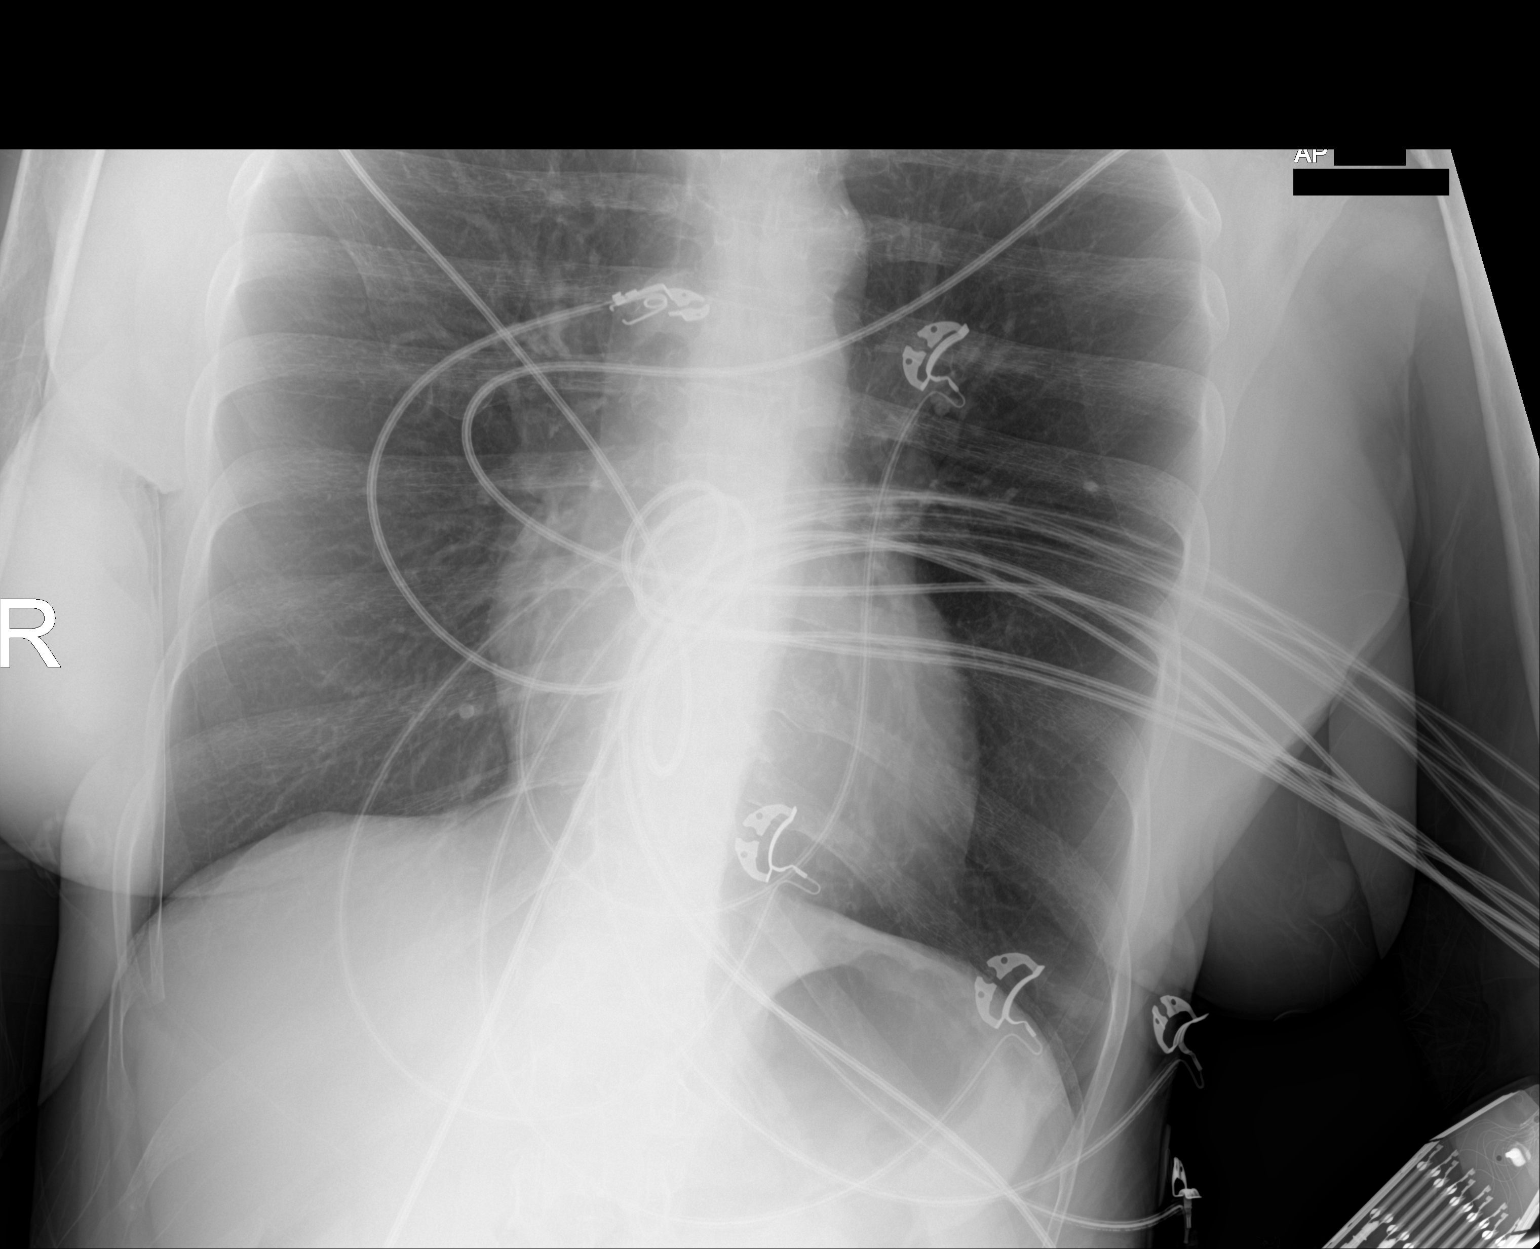

[2 of 2 positions shown; findings below may reference images not displayed]

FINDINGS: Normal heart size and mediastinal contours. No acute infiltrate or
edema. Calcified pulmonary nodule on the left. Extensive artifact
from EKG leads. No effusion or pneumothorax. No acute osseous
findings.
IMPRESSION: No active disease.

## 2020-09-30 MED ORDER — IPRATROPIUM-ALBUTEROL 0.5-2.5 (3) MG/3ML IN SOLN
3.0000 mL | Freq: Four times a day (QID) | RESPIRATORY_TRACT | Status: DC
  Administered 2020-09-30 (×3): 3 mL via RESPIRATORY_TRACT
  Filled 2020-09-30 (×4): qty 3

## 2020-09-30 MED ORDER — AMLODIPINE BESYLATE 10 MG PO TABS
10.0000 mg | ORAL_TABLET | Freq: Every day | ORAL | Status: DC
  Administered 2020-09-30 – 2020-10-02 (×3): 10 mg via ORAL
  Filled 2020-09-30 (×3): qty 1

## 2020-09-30 MED ORDER — ATORVASTATIN CALCIUM 40 MG PO TABS
40.0000 mg | ORAL_TABLET | Freq: Every day | ORAL | Status: DC
  Administered 2020-10-01 – 2020-10-02 (×2): 40 mg via ORAL
  Filled 2020-09-30 (×2): qty 1

## 2020-09-30 MED ORDER — HYDROCODONE-ACETAMINOPHEN 5-325 MG PO TABS
1.0000 | ORAL_TABLET | ORAL | Status: DC | PRN
Start: 2020-09-30 — End: 2020-10-02

## 2020-09-30 MED ORDER — NITROGLYCERIN 2 % TD OINT: 1.0000 [in_us] | TOPICAL_OINTMENT | Freq: Once | TRANSDERMAL | Status: DC

## 2020-09-30 MED ORDER — HYDRALAZINE HCL 20 MG/ML IJ SOLN: 10.0000 mg | Freq: Three times a day (TID) | INTRAMUSCULAR | Status: DC | PRN

## 2020-09-30 MED ORDER — METHYLPREDNISOLONE SODIUM SUCC 40 MG IJ SOLR
40.0000 mg | Freq: Four times a day (QID) | INTRAMUSCULAR | Status: AC
  Administered 2020-09-30 – 2020-10-01 (×4): 40 mg via INTRAVENOUS
  Filled 2020-09-30 (×4): qty 1

## 2020-09-30 MED ORDER — ALBUTEROL SULFATE (2.5 MG/3ML) 0.083% IN NEBU: 2.5000 mg | INHALATION_SOLUTION | RESPIRATORY_TRACT | Status: DC | PRN

## 2020-09-30 MED ORDER — POLYETHYLENE GLYCOL 3350 17 G PO PACK: 17.0000 g | PACK | Freq: Every day | ORAL | Status: DC | PRN

## 2020-09-30 MED ORDER — AMLODIPINE BESYLATE 5 MG PO TABS: 5.0000 mg | ORAL_TABLET | Freq: Every day | ORAL | Status: DC

## 2020-09-30 MED ORDER — ACETAMINOPHEN 650 MG RE SUPP: 650.0000 mg | Freq: Four times a day (QID) | RECTAL | Status: DC | PRN

## 2020-09-30 MED ORDER — POTASSIUM CHLORIDE CRYS ER 20 MEQ PO TBCR
20.0000 meq | EXTENDED_RELEASE_TABLET | Freq: Once | ORAL | Status: AC
  Administered 2020-09-30: 20 meq via ORAL
  Filled 2020-09-30: qty 1

## 2020-09-30 MED ORDER — SODIUM CHLORIDE 0.9% FLUSH
3.0000 mL | Freq: Two times a day (BID) | INTRAVENOUS | Status: DC
  Administered 2020-09-30 – 2020-10-01 (×4): 3 mL via INTRAVENOUS

## 2020-09-30 MED ORDER — MORPHINE SULFATE (PF) 2 MG/ML IV SOLN: 2.0000 mg | INTRAVENOUS | Status: DC | PRN

## 2020-09-30 MED ORDER — IPRATROPIUM BROMIDE 0.02 % IN SOLN
0.5000 mg | Freq: Once | RESPIRATORY_TRACT | Status: AC
  Administered 2020-09-30: 0.5 mg via RESPIRATORY_TRACT
  Filled 2020-09-30: qty 2.5

## 2020-09-30 MED ORDER — ALBUTEROL (5 MG/ML) CONTINUOUS INHALATION SOLN
10.0000 mg/h | INHALATION_SOLUTION | RESPIRATORY_TRACT | Status: DC
  Administered 2020-09-30: 10 mg/h via RESPIRATORY_TRACT
  Filled 2020-09-30: qty 20

## 2020-09-30 MED ORDER — INSULIN ASPART 100 UNIT/ML IJ SOLN
0.0000 [IU] | Freq: Three times a day (TID) | INTRAMUSCULAR | Status: DC
  Administered 2020-09-30: 3 [IU] via SUBCUTANEOUS
  Administered 2020-09-30: 5 [IU] via SUBCUTANEOUS
  Filled 2020-09-30: qty 0.09

## 2020-09-30 MED ORDER — METOPROLOL SUCCINATE ER 50 MG PO TB24: 50.0000 mg | ORAL_TABLET | Freq: Every day | ORAL | Status: DC

## 2020-09-30 MED ORDER — UMECLIDINIUM BROMIDE 62.5 MCG/INH IN AEPB
1.0000 | INHALATION_SPRAY | Freq: Every day | RESPIRATORY_TRACT | Status: DC
  Administered 2020-10-01 – 2020-10-02 (×2): 1 via RESPIRATORY_TRACT
  Filled 2020-09-30: qty 7

## 2020-09-30 MED ORDER — ENOXAPARIN SODIUM 40 MG/0.4ML IJ SOSY
40.0000 mg | PREFILLED_SYRINGE | INTRAMUSCULAR | Status: DC
  Administered 2020-09-30 – 2020-10-01 (×2): 40 mg via SUBCUTANEOUS
  Filled 2020-09-30 (×2): qty 0.4

## 2020-09-30 MED ORDER — INSULIN ASPART 100 UNIT/ML IJ SOLN
0.0000 [IU] | Freq: Three times a day (TID) | INTRAMUSCULAR | Status: DC
  Administered 2020-10-01 (×2): 3 [IU] via SUBCUTANEOUS
  Administered 2020-10-01: 5 [IU] via SUBCUTANEOUS

## 2020-09-30 MED ORDER — FLUTICASONE FUROATE-VILANTEROL 200-25 MCG/INH IN AEPB: 1.0000 | INHALATION_SPRAY | Freq: Every day | RESPIRATORY_TRACT | Status: DC

## 2020-09-30 MED ORDER — ACETAMINOPHEN 325 MG PO TABS: 650.0000 mg | ORAL_TABLET | Freq: Four times a day (QID) | ORAL | Status: DC | PRN

## 2020-09-30 MED ORDER — PREDNISONE 20 MG PO TABS
40.0000 mg | ORAL_TABLET | Freq: Every day | ORAL | Status: DC
  Administered 2020-10-01 – 2020-10-02 (×2): 40 mg via ORAL
  Filled 2020-09-30 (×2): qty 2

## 2020-09-30 MED ORDER — ARFORMOTEROL TARTRATE 15 MCG/2ML IN NEBU
15.0000 ug | INHALATION_SOLUTION | Freq: Two times a day (BID) | RESPIRATORY_TRACT | Status: DC
  Administered 2020-09-30 – 2020-10-02 (×4): 15 ug via RESPIRATORY_TRACT
  Filled 2020-09-30 (×4): qty 2

## 2020-09-30 MED ORDER — INSULIN ASPART 100 UNIT/ML IJ SOLN
0.0000 [IU] | Freq: Every day | INTRAMUSCULAR | Status: DC
  Administered 2020-09-30: 3 [IU] via SUBCUTANEOUS
  Filled 2020-09-30: qty 0.05

## 2020-09-30 NOTE — H&P (Signed)
History and Physical        Hospital Admission Note Date: 09/30/2020  Patient name: Heather Austin Medical record number: [id number] Date of birth: 30-Apr-1945 Age: 75 y.o. Gender: female  PCP: Jola Baptist, PA-C  Patient coming from: home Lives with: daughter At baseline, ambulates: independently  Chief Complaint    Chief Complaint  Patient presents with  . Respiratory Distress      HPI:   This is a 75 year old female with past medical history of asthma, hypertension, hyperlipidemia, RA who presented to the ED with progressively worsening shortness of breath over the past three weeks and acutely worsened yesterday.  She states that she was recently treated with prednisone for asthma and was given nebulizer treatments at home, though this did not help her symptoms.  Her symptoms acutely worsened yesterday and she called EMS. Upon EMS arrival, she was in respiratory distress and was given Solu-Medrol, albuterol, duo nebs, magnesium and was noted to be lethargic. She denies chest pain, nausea, vomiting, fever, chills, history of VTE, leg swelling. Denies tobacco use. She was placed on Bipap in the ED and is feeling much better now.   ED Course: Afebrile, tachycardic, tachypneic,  hypertensive, placed on BiPAP.  Notable Labs: VBG: pH 7.18, PCO2 68, PO2 91, bicarb 24.  Sodium 143, K3.3, glucose 222, BUN 30, creatinine 1.27, WBC 15.0, Hb 12.9, COVID-19 and flu negative..  CXR unremarkable. Patient received ipratropium neb, continuous albuterol.    Vitals:   09/30/20 0915 09/30/20 0923  BP: (!) 160/89   Pulse: (!) 103   Resp: (!) 21   Temp:    SpO2: 100% 100%     Review of Systems:  Review of Systems  All other systems reviewed and are negative.   Medical/Social/Family History   Past Medical History: Past Medical History:  Diagnosis Date  . Asthma    Since age 63  .  HTN (hypertension), benign   . Hyperlipidemia   . Multiple joint pain   . Rheumatoid arthritis [facility])     Past Surgical History:  Procedure Laterality Date  . CATARACT EXTRACTION Bilateral     Medications: Prior to Admission medications   Medication Sig Start Date End Date Taking? Authorizing Provider  albuterol (PROAIR HFA) 108 (90 Base) MCG/ACT inhaler INHALE 2 PUFFS BY MOUTH EVERY 6 HOURS IF NEEDED 09/20/18   Baird Lyons D, MD  amLODipine (NORVASC) 5 MG tablet TK 2 TS PO QD 03/07/18   [provider]  atorvastatin (LIPITOR) 40 MG tablet TK 1 T PO QD 03/09/18   [provider]  budesonide-formoterol (SYMBICORT) 160-4.5 MCG/ACT inhaler Inhale 2 puffs then rinse mouth, twice daily 03/30/18   Baird Lyons D, MD  Cranberry-Vitamin C-Probiotic (AZO CRANBERRY) 250-30 MG TABS Take 1 tablet by mouth daily.    [provider]  ferrous sulfate 325 (65 FE) MG tablet Take 325 mg by mouth daily with breakfast.    [provider]  hydrochlorothiazide (HYDRODIURIL) 25 MG tablet  03/28/18   [provider]  metoprolol succinate (TOPROL-XL) 50 MG 24 hr tablet Take 50 mg by mouth daily. Pt stopped taking 12/09/2018 11/08/18   [provider]  potassium chloride (K-DUR) 10 MEQ tablet Take  10 mEq by mouth daily. 09/04/14   [provider]  predniSONE (DELTASONE) 20 MG tablet Take 2 tablets daily with breakfast. 05/17/20   Jaynee Eagles, PA-C  promethazine-dextromethorphan (PROMETHAZINE-DM) 6.25-15 MG/5ML syrup Take 5 mLs by mouth at bedtime as needed for cough. 05/17/20   Jaynee Eagles, PA-C  valsartan (DIOVAN) 320 MG tablet Take 1 tablet by mouth daily. 06/04/18   [provider]    Allergies:   Allergies  Allergen Reactions  . Latex Diarrhea and Rash         Social History:  reports that she has never smoked. She has never used smokeless tobacco. She reports that she does not drink alcohol and does not use drugs.  Family  History: Family History  Problem Relation Age of Onset  . Hypertension Mother   . Asthma Sister   . Allergies Son   . Allergies Daughter      Objective   Physical Exam: Blood pressure (!) 160/89, pulse (!) 103, temperature 99.8 F (37.7 C), temperature source Rectal, resp. rate (!) 21, height 5\' 7"  (1.702 m), weight 68 kg, SpO2 100 %.  Physical Exam Vitals and nursing note reviewed.  Constitutional:      Appearance: Normal appearance.  HENT:     Head: Normocephalic and atraumatic.  Eyes:     Conjunctiva/sclera: Conjunctivae normal.  Cardiovascular:     Rate and Rhythm: Normal rate and regular rhythm.  Pulmonary:     Effort: Pulmonary effort is normal.     Breath sounds: Wheezing present.  Abdominal:     General: Abdomen is flat.     Palpations: Abdomen is soft.  Musculoskeletal:        General: No swelling or tenderness.  Skin:    Coloration: Skin is not jaundiced or pale.  Neurological:     Mental Status: She is alert. Mental status is at baseline.  Psychiatric:        Mood and Affect: Mood normal.        Behavior: Behavior normal.     LABS on Admission: I have personally reviewed all the labs and imaging below    Basic Metabolic Panel: Recent Labs  Lab 09/30/20 0522  NA 143  K 3.3*  CL 108  CO2 24  GLUCOSE 222*  BUN 30*  CREATININE 1.27*  CALCIUM 9.4   Liver Function Tests: No results for input(s): AST, ALT, ALKPHOS, BILITOT, PROT, ALBUMIN in the last 168 hours. No results for input(s): LIPASE, AMYLASE in the last 168 hours. No results for input(s): AMMONIA in the last 168 hours. CBC: Recent Labs  Lab 09/30/20 0522  WBC 15.0*  NEUTROABS 10.2*  HGB 12.9  HCT 39.6  MCV 97.1  PLT 213   Cardiac Enzymes: No results for input(s): CKTOTAL, CKMB, CKMBINDEX, TROPONINI in the last 168 hours. BNP: Invalid input(s): POCBNP CBG: No results for input(s): GLUCAP in the last 168 hours.  Radiological Exams on Admission:  DG Chest Port 1  View  Result Date: 09/30/2020 CLINICAL DATA:  Respiratory distress EXAM: PORTABLE CHEST 1 VIEW COMPARISON:  05/17/2020 FINDINGS: Normal heart size and mediastinal contours. No acute infiltrate or edema. Calcified pulmonary nodule on the left. Extensive artifact from EKG leads. No effusion or pneumothorax. No acute osseous findings. IMPRESSION: No active disease. Electronically Signed   By: Monte Fantasia M.D.   On: 09/30/2020 05:55      EKG: normal sinus rhythm, occasional PVC noted, unifocal, possible LVH   A & P   Principal Problem:  Acute respiratory failure with hypercapnia (HCC) Active Problems:   Asthma exacerbation   Essential hypertension   CKD (chronic kidney disease), stage III (HCC)   Hyperglycemia   1. Acute hypercapnic respiratory failure secondary to asthma exacerbation a. VBG with elevated CO2 b. Doing better now on BiPAP c. Symbicort changed to [facility] while inpatient d. Continue steroids e. Scheduled duo nebs and as needed albuterol  2. Asymptomatic hypokalemia a. Replete p.o.  3. Hypertension a. Continue home amlodipine and Toprol-XL, holding HCTZ and valsartan  4. CKD 3a, baseline  5. Leukocytosis,suspect steroid-induced  6. Hyperglycemia a. Check HbA1c b. Sliding scale insulin while on steroids  7. Rheumatoid arthritis, stable a. Currently off DMARDs     DVT prophylaxis: Lovenox   Code Status: Full Code  Diet: Heart healthy carb modified Family Communication: Admission, patients condition and plan of care including tests being ordered have been discussed with the patient who indicates understanding and agrees with the plan and Code Status. Patient's daughter was updated  Disposition Plan: The appropriate patient status for this patient is INPATIENT. Inpatient status is judged to be reasonable and necessary in order to provide the required intensity of service to ensure the patient's safety. The patient's presenting symptoms, physical exam  findings, and initial radiographic and laboratory data in the context of their chronic comorbidities is felt to place them at high risk for further clinical deterioration. Furthermore, it is not anticipated that the patient will be medically stable for discharge from the hospital within 2 midnights of admission. The following factors support the patient status of inpatient.   " The patient's presenting symptoms include shortness of breath. " The worrisome physical exam findings include wheezing. " The initial radiographic and laboratory data are worrisome because of hypercapnia. " The chronic co-morbidities include asthma.   * I certify that at the point of admission it is my clinical judgment that the patient will require inpatient hospital care spanning beyond 2 midnights from the point of admission due to high intensity of service, high risk for further deterioration and high frequency of surveillance required.*    The medical decision making on this patient was of high complexity and the patient is at high risk for clinical deterioration, therefore this is a level 3  admission.  Consultants  . None  Procedures  . BiPAP  Time Spent on Admission: 70 minutes    Harold Hedge, DO [facility]  09/30/2020, 9:46 AM

## 2020-09-30 NOTE — ED Triage Notes (Signed)
Patient came from home by [facility] in respiratory distress. Patient has #24 in left hand. Patient was given 0.3mg  IM, 125 mg of solumedrol, 10 mg of albuterol, duo neb, and 2 grams of mag. Patient is very lethargic. Patient was having sob all day at home and getting breathing treatments. Patient got worse and called 911.

## 2020-09-30 NOTE — Progress Notes (Signed)
Transported on BiPAP to ED room 14 from Triage 1 without event. VSS

## 2020-09-30 NOTE — ED Provider Notes (Signed)
[facility] DEPT Provider Note  CSN: [id number] Arrival date & time: 09/30/20 0449  Chief Complaint(s) Respiratory Distress  HPI Heather Austin is a 75 y.o. female the past medical history listed below including asthma who presents to the emergency department in respiratory distress.  Onset was gradual over the past 1 to 2 days.  No alleviation with her home medication prompting call to EMS. They noted the patient had diffuse inspiratory and expiratory wheezes. She received 2 DuoNeb's, magnesium, Solu-Medrol, epi in route. She was satting 90s on nonrebreather. Patient was also mildly somnolent.  No known fevers, chills, recent infections.  Remainder of history, ROS, and physical exam limited due to patient's condition (acute of condition). Additional information was obtained from EMS.   Level V Caveat.    HPI  Past Medical History Past Medical History:  Diagnosis Date  . Asthma    Since age 87  . HTN (hypertension), benign   . Hyperlipidemia   . Multiple joint pain   . Rheumatoid arthritis [facility])    Patient Active Problem List   Diagnosis Date Noted  . Asthma exacerbation 09/30/2020  . Moderate intermittent asthma with acute exacerbation 06/04/2016  . Rheumatoid arthritis ([place]) 06/04/2016  . DOE (dyspnea on exertion) 03/02/2013  . Asthma with COPD ([place]) 03/13/2012   Home Medication(s) Prior to Admission medications   Medication Sig Start Date End Date Taking? Authorizing Provider  albuterol (PROAIR HFA) 108 (90 Base) MCG/ACT inhaler INHALE 2 PUFFS BY MOUTH EVERY 6 HOURS IF NEEDED 09/20/18   Baird Lyons D, MD  amLODipine (NORVASC) 5 MG tablet TK 2 TS PO QD 03/07/18   [provider]  atorvastatin (LIPITOR) 40 MG tablet TK 1 T PO QD 03/09/18   [provider]  budesonide-formoterol (SYMBICORT) 160-4.5 MCG/ACT inhaler Inhale 2 puffs then rinse mouth, twice daily 03/30/18   Baird Lyons D, MD  Cranberry-Vitamin C-Probiotic  (AZO CRANBERRY) 250-30 MG TABS Take 1 tablet by mouth daily.    [provider]  ferrous sulfate 325 (65 FE) MG tablet Take 325 mg by mouth daily with breakfast.    [provider]  hydrochlorothiazide (HYDRODIURIL) 25 MG tablet  03/28/18   [provider]  metoprolol succinate (TOPROL-XL) 50 MG 24 hr tablet Take 50 mg by mouth daily. Pt stopped taking 12/09/2018 11/08/18   [provider]  potassium chloride (K-DUR) 10 MEQ tablet Take 10 mEq by mouth daily. 09/04/14   [provider]  predniSONE (DELTASONE) 20 MG tablet Take 2 tablets daily with breakfast. 05/17/20   Jaynee Eagles, PA-C  promethazine-dextromethorphan (PROMETHAZINE-DM) 6.25-15 MG/5ML syrup Take 5 mLs by mouth at bedtime as needed for cough. 05/17/20   Jaynee Eagles, PA-C  valsartan (DIOVAN) 320 MG tablet Take 1 tablet by mouth daily. 06/04/18   [provider]  Past Surgical History Past Surgical History:  Procedure Laterality Date  . CATARACT EXTRACTION Bilateral    Family History Family History  Problem Relation Age of Onset  . Hypertension Mother   . Asthma Sister   . Allergies Son   . Allergies Daughter     Social History Social History   Tobacco Use  . Smoking status: Never Smoker  . Smokeless tobacco: Never Used  Substance Use Topics  . Alcohol use: No  . Drug use: No   Allergies Latex  Review of Systems Review of Systems Unable to obtain due to acuity of condition/respiratory distress. Physical Exam Vital Signs  I have reviewed the triage vital signs BP 133/70   Pulse (!) 106   Temp 99.8 F (37.7 C) (Rectal)   Resp (!) 23   Ht 5\' 7"  (1.702 m)   Wt 68 kg   SpO2 97%   BMI 23.49 kg/m   Physical Exam Vitals reviewed.  Constitutional:      General: She is not in acute distress.    Appearance: She is well-developed. She is  not diaphoretic.  HENT:     Head: Normocephalic and atraumatic.     Nose: Nose normal.  Eyes:     General: No scleral icterus.       Right eye: No discharge.        Left eye: No discharge.     Conjunctiva/sclera: Conjunctivae normal.     Pupils: Pupils are equal, round, and reactive to light.  Cardiovascular:     Rate and Rhythm: Normal rate and regular rhythm.     Heart sounds: No murmur heard. No friction rub. No gallop.   Pulmonary:     Effort: Tachypnea, accessory muscle usage, prolonged expiration and respiratory distress present.     Breath sounds: No stridor. Wheezing (diffuse insp and exp.) present. No rales.  Abdominal:     General: There is no distension.     Palpations: Abdomen is soft.     Tenderness: There is no abdominal tenderness.  Musculoskeletal:        General: No tenderness.     Cervical back: Normal range of motion and neck supple.  Skin:    General: Skin is warm and dry.     Findings: No erythema or rash.  Neurological:     Mental Status: She is alert and oriented to person, place, and time.     ED Results and Treatments Labs (all labs ordered are listed, but only abnormal results are displayed) Labs Reviewed  CBC WITH DIFFERENTIAL/PLATELET - Abnormal; Notable for the following components:      Result Value   WBC 15.0 (*)    Neutro Abs 10.2 (*)    Abs Immature Granulocytes 0.08 (*)    All other components within normal limits  BASIC METABOLIC PANEL - Abnormal; Notable for the following components:   Potassium 3.3 (*)    Glucose, Bld 222 (*)    BUN 30 (*)    Creatinine, Ser 1.27 (*)    GFR, Estimated 44 (*)    All other components within normal limits  BLOOD GAS, VENOUS - Abnormal; Notable for the following components:   pH, Ven 7.184 (*)    pCO2, Ven 68.6 (*)    pO2, Ven 91.3 (*)    Acid-base deficit 4.6 (*)    All other components within normal limits  BLOOD GAS, VENOUS - Abnormal; Notable for the following components:   pO2, Ven 31.8 (*)  Acid-base deficit 4.1 (*)    All other components within normal limits  RESP PANEL BY RT-PCR (FLU A&B, COVID) [id number]                                                                                                                         EKG  EKG Interpretation  Date/Time:  Tuesday September 30 2020 05:12:47 EDT Ventricular Rate:  126 PR Interval:  114 QRS Duration: 84 QT Interval:  340 QTC Calculation: 493 R Axis:   70 Text Interpretation: Sinus tachycardia Atrial premature complexes Consider left ventricular hypertrophy Repol abnrm suggests ischemia, diffuse leads 12 Lead; [place] Since last tracing rate faster Confirmed by Addison Lank [phone]) on 09/30/2020 7:54:35 AM      Radiology DG Chest Port 1 View  Result Date: 09/30/2020 CLINICAL DATA:  Respiratory distress EXAM: PORTABLE CHEST 1 VIEW COMPARISON:  05/17/2020 FINDINGS: Normal heart size and mediastinal contours. No acute infiltrate or edema. Calcified pulmonary nodule on the left. Extensive artifact from EKG leads. No effusion or pneumothorax. No acute osseous findings. IMPRESSION: No active disease. Electronically Signed   By: Monte Fantasia M.D.   On: 09/30/2020 05:55    Pertinent labs & imaging results that were available during my care of the patient were reviewed by me and considered in my medical decision making (see chart for details).  Medications Ordered in ED Medications  albuterol (PROVENTIL,VENTOLIN) solution continuous neb (0 mg/hr Nebulization Stopped 09/30/20 0620)  amLODipine (NORVASC) tablet 5 mg (has no administration in time range)  atorvastatin (LIPITOR) tablet 40 mg (has no administration in time range)  metoprolol succinate (TOPROL-XL) 24 hr tablet 50 mg (has no administration in time range)  fluticasone furoate-vilanterol (BREO ELLIPTA) 200-25 MCG/INH 1 puff (has no administration in time range)  acetaminophen (TYLENOL) tablet 650 mg (has no administration in time range)    Or  acetaminophen  (TYLENOL) suppository 650 mg (has no administration in time range)  HYDROcodone-acetaminophen (NORCO/VICODIN) 5-325 MG per tablet 1 tablet (has no administration in time range)  morphine 2 MG/ML injection 2 mg (has no administration in time range)  polyethylene glycol (MIRALAX / GLYCOLAX) packet 17 g (has no administration in time range)  methylPREDNISolone sodium succinate (SOLU-MEDROL) 40 mg/mL injection 40 mg (has no administration in time range)    Followed by  predniSONE (DELTASONE) tablet 40 mg (has no administration in time range)  ipratropium-albuterol (DUONEB) 0.5-2.5 (3) MG/3ML nebulizer solution 3 mL (has no administration in time range)  albuterol (PROVENTIL) (2.5 MG/3ML) 0.083% nebulizer solution 2.5 mg (has no administration in time range)  enoxaparin (LOVENOX) injection 40 mg (has no administration in time range)  sodium chloride flush (NS) 0.9 % injection 3 mL (has no administration in time range)  hydrALAZINE (APRESOLINE) injection 10 mg (has no administration in time range)  ipratropium (ATROVENT) nebulizer solution 0.5 mg (0.5 mg Nebulization Given 09/30/20 0522)  Procedures .1-3 Lead EKG Interpretation Performed by: Fatima Blank, MD Authorized by: Fatima Blank, MD     Interpretation: abnormal     ECG rate:  111   ECG rate assessment: tachycardic     Rhythm: sinus tachycardia     Ectopy: none     Conduction: normal   .Critical Care Performed by: Fatima Blank, MD Authorized by: Fatima Blank, MD   Critical care provider statement:    Critical care time (minutes):  45   Critical care was necessary to treat or prevent imminent or life-threatening deterioration of the following conditions:  Respiratory failure   Critical care was time spent personally by me on the following activities:  Discussions with  consultants, evaluation of patient's response to treatment, examination of patient, ordering and performing treatments and interventions, ordering and review of laboratory studies, ordering and review of radiographic studies, pulse oximetry, re-evaluation of patient's condition, obtaining history from patient or surrogate and review of old charts    (including critical care time)  Medical Decision Making / ED Course I have reviewed the nursing notes for this encounter and the patient's prior records (if available in EHR or on provided paperwork).   Heather Austin was evaluated in Emergency Department on 09/30/2020 for the symptoms described in the history of present illness. She was evaluated in the context of the global COVID-19 pandemic, which necessitated consideration that the patient might be at risk for infection with the SARS-CoV-2 virus that causes COVID-19. Institutional protocols and algorithms that pertain to the evaluation of patients at risk for COVID-19 are in a state of rapid change based on information released by regulatory bodies including the CDC and federal and state organizations. These policies and algorithms were followed during the patient's care in the ED.  Respiratory distress consistent with asthma exacerbation. Patient required BiPAP. Confirmed respiratory acidosis with VBG. Chest x-ray without evidence of pneumonia. Patient with leukocytosis likely from demargination. COVID and influenza negative. Renal function intact. Repeat VBG showed improved acidosis and CO2.  Admitted to medicine.        Final Clinical Impression(s) / ED Diagnoses Final diagnoses:  Respiratory distress      This chart was dictated using voice recognition software.  Despite best efforts to proofread,  errors can occur which can change the documentation meaning.   Fatima Blank, MD 09/30/20 [phone]

## 2020-09-30 NOTE — ED Notes (Signed)
Pt received meal tray. 

## 2020-09-30 NOTE — ED Notes (Signed)
Pt placed on bedpan, refused purewick.

## 2020-10-01 LAB — GLUCOSE, CAPILLARY
Glucose-Capillary: 170 mg/dL — ABNORMAL HIGH (ref 70–99)
Glucose-Capillary: 240 mg/dL — ABNORMAL HIGH (ref 70–99)
Glucose-Capillary: 178 mg/dL — ABNORMAL HIGH (ref 70–99)
Glucose-Capillary: 200 mg/dL — ABNORMAL HIGH (ref 70–99)

## 2020-10-01 LAB — BASIC METABOLIC PANEL
Anion gap: 8 (ref 5–15)
BUN: 38 mg/dL — ABNORMAL HIGH (ref 8–23)
CO2: 23 mmol/L (ref 22–32)
Calcium: 9.4 mg/dL (ref 8.9–10.3)
Chloride: 110 mmol/L (ref 98–111)
Creatinine, Ser: 1.24 mg/dL — ABNORMAL HIGH (ref 0.44–1.00)
GFR, Estimated: 45 mL/min — ABNORMAL LOW (ref >60)
Glucose, Bld: 175 mg/dL — ABNORMAL HIGH (ref 70–99)
Potassium: 3.4 mmol/L — ABNORMAL LOW (ref 3.5–5.1)
Sodium: 141 mmol/L (ref 135–145)

## 2020-10-01 LAB — CBC
HCT: 34.4 % — ABNORMAL LOW (ref 36.0–46.0)
Hemoglobin: 11.3 g/dL — ABNORMAL LOW (ref 12.0–15.0)
MCH: 31.7 pg (ref 26.0–34.0)
MCHC: 32.8 g/dL (ref 30.0–36.0)
MCV: 96.4 fL (ref 80.0–100.0)
Platelets: 168 10*3/uL (ref 150–400)
RBC: 3.57 MIL/uL — ABNORMAL LOW (ref 3.87–5.11)
RDW: 13 % (ref 11.5–15.5)
WBC: 21.1 10*3/uL — ABNORMAL HIGH (ref 4.0–10.5)
nRBC: 0 % (ref 0.0–0.2)

## 2020-10-01 LAB — HEMOGLOBIN A1C
Hgb A1c MFr Bld: 5.4 % (ref 4.8–5.6)
Mean Plasma Glucose: 108 mg/dL

## 2020-10-01 MED ORDER — POTASSIUM CHLORIDE 20 MEQ PO PACK
40.0000 meq | PACK | Freq: Once | ORAL | Status: AC
  Administered 2020-10-01: 40 meq via ORAL
  Filled 2020-10-01: qty 2

## 2020-10-01 MED ORDER — IPRATROPIUM-ALBUTEROL 0.5-2.5 (3) MG/3ML IN SOLN: 3.0000 mL | RESPIRATORY_TRACT | Status: DC | PRN

## 2020-10-01 MED ORDER — IPRATROPIUM-ALBUTEROL 0.5-2.5 (3) MG/3ML IN SOLN
3.0000 mL | Freq: Three times a day (TID) | RESPIRATORY_TRACT | Status: DC
  Administered 2020-10-01 (×2): 3 mL via RESPIRATORY_TRACT
  Filled 2020-10-01 (×3): qty 3

## 2020-10-01 NOTE — Plan of Care (Signed)

## 2020-10-01 NOTE — Progress Notes (Signed)
Assumed care of patient, agree with previous RN assessment

## 2020-10-01 NOTE — Progress Notes (Addendum)
PROGRESS NOTE    Heather Austin  XHB:[id number] DOB: Sep 10, 1945 DOA: 09/30/2020 PCP: Jola Baptist, PA-C   Brief Narrative: This 75 years old female with PMH significant for asthma, hypertension, hyperlipidemia, rheumatoid arthritis presented in the ED with progressively worsening shortness of breath over the past 3 weeks which has gotten worse today.  She reports she was recently treated with prednisone for asthma and was given nebulizer treatment at home which did not relieve her symptoms.  Her symptoms acutely worsened yesterday and EMS was called,  upon EMS arrival she was in respiratory distress and was given Solu-Medrol, albuterol, DuoNeb, magnesium and was also noted to be lethargic.  She denies any chest pain, nausea, vomiting, fever, chills, history of VTE, left leg swelling.  Patient was found to be tachycardic, tachypneic, hypertensive in the ED and placed on BiPAP.  Patient is admitted for asthma exacerbation.  Assessment & Plan:   Principal Problem:   Acute respiratory failure with hypercapnia (HCC) Active Problems:   Asthma exacerbation   Essential hypertension   CKD (chronic kidney disease), stage III (HCC)   Hyperglycemia   Acute hypoxic and hypercapnic respiratory failure secondary to asthma exacerbation: Patient presented with worsening shortness of breath. VBG with elevated PCO2. Patient placed on BiPAP and doing much improved. Continue Breo ellipta, IV Solu-Medrol. Continue scheduled duo nebs and as needed albuterol. Continue supplemental oxygen to keep saturation above 92%.  Hypertension: Continue home amlodipine and Toprol-XL. Hold HCTZ and losartan,  CKD stage IIIb: Serum creatinine at baseline.  Leukocytosis could be secondary to steroids.  Rheumatoid arthritis :> stable: Currently off DM ARDS.  Hypokalemia: Replaced.  Continue to monitor    DVT prophylaxis:  Lovenox. Code Status: Full code. Family Communication: No family at bed  side. Disposition Plan:    Status is: Inpatient  Remains inpatient appropriate because:Inpatient level of care appropriate due to severity of illness   Dispo: The patient is from: Home              Anticipated d/c is to: Home              Patient currently is not medically stable to d/c.   Difficult to place patient No  Consultants:    None  Procedures:  None. Antimicrobials:   Anti-infectives (From admission, onward)   None     Subjective: Patient was seen and examined at bedside.  Overnight events noted.  She reports feeling much improved. She is not on oxygen at home.  She feels very weak, denies any other concerns.  Objective: Vitals:   10/01/20 0718 10/01/20 0800 10/01/20 0813 10/01/20 1330  BP: 129/67   (!) 104/55  Pulse: 84   87  Resp: 20 19  14   Temp: 98.6 F (37 C)   99.2 F (37.3 C)  TempSrc: Oral   Oral  SpO2: 100%  100% 100%  Weight:      Height:        Intake/Output Summary (Last 24 hours) at 10/01/2020 1540 Last data filed at 10/01/2020 1500 Gross per 24 hour  Intake 180 ml  Output --  Net 180 ml   Filed Weights   09/30/20 0526  Weight: 68 kg    Examination:  General exam: Appears calm and comfortable, not in any acute distress. Respiratory system: Clear to auscultation. Respiratory effort normal. Cardiovascular system: S1 & S2 heard, RRR. No JVD, murmurs, rubs, gallops or clicks. No pedal edema. Gastrointestinal system: Abdomen is nondistended, soft and nontender. No organomegaly  or masses felt. Normal bowel sounds heard. Central nervous system: Alert and oriented. No focal neurological deficits. Extremities: Symmetric 5 x 5 power.  No edema, no cyanosis, no clubbing. Skin: No rashes, lesions or ulcers Psychiatry: Judgement and insight appear normal. Mood & affect appropriate.     Data Reviewed: I have personally reviewed following labs and imaging studies  CBC: Recent Labs  Lab 09/30/20 0522 10/01/20 0515  WBC 15.0* 21.1*   NEUTROABS 10.2*  --   HGB 12.9 11.3*  HCT 39.6 34.4*  MCV 97.1 96.4  PLT 213 295   Basic Metabolic Panel: Recent Labs  Lab 09/30/20 0522 10/01/20 0515  NA 143 141  K 3.3* 3.4*  CL 108 110  CO2 24 23  GLUCOSE 222* 175*  BUN 30* 38*  CREATININE 1.27* 1.24*  CALCIUM 9.4 9.4   GFR: Estimated Creatinine Clearance: 37.5 mL/min (A) (by C-G formula based on SCr of 1.24 mg/dL (H)). Liver Function Tests: No results for input(s): AST, ALT, ALKPHOS, BILITOT, PROT, ALBUMIN in the last 168 hours. No results for input(s): LIPASE, AMYLASE in the last 168 hours. No results for input(s): AMMONIA in the last 168 hours. Coagulation Profile: No results for input(s): INR, PROTIME in the last 168 hours. Cardiac Enzymes: No results for input(s): CKTOTAL, CKMB, CKMBINDEX, TROPONINI in the last 168 hours. BNP (last 3 results) No results for input(s): PROBNP in the last 8760 hours. HbA1C: Recent Labs    09/30/20 0522  HGBA1C 5.4   CBG: Recent Labs  Lab 09/30/20 1141 09/30/20 1558 09/30/20 2139 10/01/20 0716 10/01/20 1113  GLUCAP 244* 286* 253* 170* 240*   Lipid Profile: No results for input(s): CHOL, HDL, LDLCALC, TRIG, CHOLHDL, LDLDIRECT in the last 72 hours. Thyroid Function Tests: No results for input(s): TSH, T4TOTAL, FREET4, T3FREE, THYROIDAB in the last 72 hours. Anemia Panel: No results for input(s): VITAMINB12, FOLATE, FERRITIN, TIBC, IRON, RETICCTPCT in the last 72 hours. Sepsis Labs: No results for input(s): PROCALCITON, LATICACIDVEN in the last 168 hours.  Recent Results (from the past 240 hour(s))  Resp Panel by RT-PCR (Flu A&B, Covid) Nasopharyngeal Swab     Status: None   Collection Time: 09/30/20  5:22 AM   Specimen: Nasopharyngeal Swab; Nasopharyngeal(NP) swabs in vial transport medium  Result Value Ref Range Status   SARS Coronavirus 2 by RT PCR NEGATIVE NEGATIVE Final    Comment: (NOTE) SARS-CoV-2 target nucleic acids are NOT DETECTED.  The SARS-CoV-2 RNA is  generally detectable in upper respiratory specimens during the acute phase of infection. The lowest concentration of SARS-CoV-2 viral copies this assay can detect is 138 copies/mL. A negative result does not preclude SARS-Cov-2 infection and should not be used as the sole basis for treatment or other patient management decisions. A negative result may occur with  improper specimen collection/handling, submission of specimen other than nasopharyngeal swab, presence of viral mutation(s) within the areas targeted by this assay, and inadequate number of viral copies(<138 copies/mL). A negative result must be combined with clinical observations, patient history, and epidemiological information. The expected result is Negative.  Fact Sheet for Patients:  EntrepreneurPulse.com.au  Fact Sheet for Healthcare Providers:  IncredibleEmployment.be  This test is no t yet approved or cleared by the [place] FDA and  has been authorized for detection and/or diagnosis of SARS-CoV-2 by FDA under an Emergency Use Authorization (EUA). This EUA will remain  in effect (meaning this test can be used) for the duration of the COVID-19 declaration under Section 564(b)(1) of the  Act, 21 U.S.C.section 360bbb-3(b)(1), unless the authorization is terminated  or revoked sooner.       Influenza A by PCR NEGATIVE NEGATIVE Final   Influenza B by PCR NEGATIVE NEGATIVE Final    Comment: (NOTE) The Xpert Xpress SARS-CoV-2/FLU/RSV plus assay is intended as an aid in the diagnosis of influenza from Nasopharyngeal swab specimens and should not be used as a sole basis for treatment. Nasal washings and aspirates are unacceptable for Xpert Xpress SARS-CoV-2/FLU/RSV testing.  Fact Sheet for Patients: EntrepreneurPulse.com.au  Fact Sheet for Healthcare Providers: IncredibleEmployment.be  This test is not yet approved or cleared by the [place] FDA and has been authorized for detection and/or diagnosis of SARS-CoV-2 by FDA under an Emergency Use Authorization (EUA). This EUA will remain in effect (meaning this test can be used) for the duration of the COVID-19 declaration under Section 564(b)(1) of the Act, 21 U.S.C. section 360bbb-3(b)(1), unless the authorization is terminated or revoked.  Performed at [facility], [place] [address], [place], [place] [address]     Radiology Studies: [facility] Chest Port 1 View  Result Date: 09/30/2020 CLINICAL DATA:  Respiratory distress EXAM: PORTABLE CHEST 1 VIEW COMPARISON:  05/17/2020 FINDINGS: Normal heart size and mediastinal contours. No acute infiltrate or edema. Calcified pulmonary nodule on the left. Extensive artifact from EKG leads. No effusion or pneumothorax. No acute osseous findings. IMPRESSION: No active disease. Electronically Signed   By: Monte Fantasia M.D.   On: 09/30/2020 05:55   Scheduled Meds: . amLODipine  10 mg Oral Daily  . arformoterol  15 mcg Nebulization BID   And  . umeclidinium bromide  1 puff Inhalation Daily  . atorvastatin  40 mg Oral Daily  . enoxaparin (LOVENOX) injection  40 mg Subcutaneous Q24H  . insulin aspart  0-15 Units Subcutaneous TID WC  . insulin aspart  0-5 Units Subcutaneous QHS  . ipratropium-albuterol  3 mL Nebulization TID  . predniSONE  40 mg Oral Q breakfast  . sodium chloride flush  3 mL Intravenous Q12H   Continuous Infusions:   LOS: 1 day    Time spent: 35 mins.    Shawna Clamp, MD [facility]   If 7PM-7AM, please contact night-coverage

## 2020-10-02 LAB — CBC
HCT: 32.6 % — ABNORMAL LOW (ref 36.0–46.0)
Hemoglobin: 10.7 g/dL — ABNORMAL LOW (ref 12.0–15.0)
MCH: 31.7 pg (ref 26.0–34.0)
MCHC: 32.8 g/dL (ref 30.0–36.0)
MCV: 96.4 fL (ref 80.0–100.0)
Platelets: 159 10*3/uL (ref 150–400)
RBC: 3.38 MIL/uL — ABNORMAL LOW (ref 3.87–5.11)
RDW: 13.1 % (ref 11.5–15.5)
WBC: 25 10*3/uL — ABNORMAL HIGH (ref 4.0–10.5)
nRBC: 0 % (ref 0.0–0.2)

## 2020-10-02 LAB — GLUCOSE, CAPILLARY
Glucose-Capillary: 111 mg/dL — ABNORMAL HIGH (ref 70–99)
Glucose-Capillary: 185 mg/dL — ABNORMAL HIGH (ref 70–99)

## 2020-10-02 LAB — BASIC METABOLIC PANEL
Anion gap: 6 (ref 5–15)
BUN: 45 mg/dL — ABNORMAL HIGH (ref 8–23)
CO2: 22 mmol/L (ref 22–32)
Calcium: 9.3 mg/dL (ref 8.9–10.3)
Chloride: 112 mmol/L — ABNORMAL HIGH (ref 98–111)
Creatinine, Ser: 1.22 mg/dL — ABNORMAL HIGH (ref 0.44–1.00)
GFR, Estimated: 46 mL/min — ABNORMAL LOW (ref >60)
Glucose, Bld: 122 mg/dL — ABNORMAL HIGH (ref 70–99)
Potassium: 4 mmol/L (ref 3.5–5.1)
Sodium: 140 mmol/L (ref 135–145)

## 2020-10-02 MED ORDER — BUDESONIDE-FORMOTEROL FUMARATE 160-4.5 MCG/ACT IN AERO: INHALATION_SPRAY | RESPIRATORY_TRACT | 12 refills | Status: DC

## 2020-10-02 MED ORDER — PREDNISONE 20 MG PO TABS: 40.0000 mg | ORAL_TABLET | Freq: Every day | ORAL | 0 refills | Status: AC

## 2020-10-02 NOTE — TOC Transition Note (Signed)
Transition of Care [facility]) - CM/SW Discharge Note   Patient Details  Name: Heather Austin MRN: [id number] Date of Birth: 08/30/1945  Transition of Care [facility]) CM/SW Contact:  Ross Ludwig, LCSW Phone Number: 10/02/2020, 1:27 PM   Clinical Narrative:     CSW was informed that patient's daughter had questions about home health services.  CSW contacted patient's daughter Ebony Hail [phone] and explained that a [place] PT and social worker can be arranged.  Patient's daughter would like some support in the home and also finding other options for possible placement from home or in home care providers.  CSW asked if they had a preference for agencies, and daughter said no.  CSW contacted [facility] and they are able to accept patient for home health services.  CSW asked if she had any other equipment needs, and daughter said no.  Patient's daughter will be picking patient up once ready for discharge.  CSW to sign off, no further needs from CSW.   Final next level of care: [place] Barriers to Discharge: Barriers Resolved   Patient Goals and CMS Choice Patient states their goals for this hospitalization and ongoing recovery are:: To return home with daughter. CMS Medicare.gov Compare Post Acute Care list provided to:: Patient Represenative (must comment) Choice offered to / list presented to : Adult Children  Discharge Placement  Patient discharging home with home health services.                     Discharge Plan and Services                          HH Arranged: PT, Social Work [facility] Agency: [place] Date [place]: 10/02/20 Time [place]: [place] Representative spoke with at [place]: [place] ([place]) Interventions     Readmission Risk Interventions No flowsheet data found.

## 2020-10-02 NOTE — TOC Progression Note (Signed)
Transition of Care [facility]) - Progression Note    Patient Details  Name: Heather Austin MRN: [id number] Date of Birth: 01-21-1946  Transition of Care [facility]) CM/SW Contact  Ross Ludwig, [place] Phone Number: 10/02/2020, 11:53 AM  Clinical Narrative:     CSW attempted to contact patient's daughter Carmell Austria to discuss in home services left a message waiting for a call back.        Expected Discharge Plan and Services           Expected Discharge Date: 10/02/20                                     Social Determinants of Health (SDOH) Interventions    Readmission Risk Interventions No flowsheet data found.

## 2020-10-02 NOTE — Discharge Instructions (Signed)
Advised to follow-up with primary care physician in 1 week. Advised to take prednisone 40 mg daily for 3 more days Advised to follow-up with inhalers and continue nebulization as needed.

## 2020-10-02 NOTE — Progress Notes (Signed)
Heather Austin to be D/C'd Home per MD order. Discussed with the patient and all questions fully answered.    IV catheter discontinued intact. Site without signs and symptoms of complications. Dressing and pressure applied.  An After Visit Summary was printed and given to the patient.  Patient escorted via [place], and D/C home via private auto.  [place] Numbers  10/02/2020

## 2020-10-02 NOTE — Discharge Summary (Addendum)
Physician Discharge Summary  Heather Austin [id number] DOB: December 19, 1945 DOA: 09/30/2020  PCP: Heather Baptist, PA-C  Admit date: 09/30/2020  Discharge date: 10/02/2020  Admitted From:  Home  Disposition:  Home with Home services . Home PT/ Social work.  Recommendations for Outpatient Follow-up:  Follow up with PCP in 1-2 weeks. Please obtain BMP/CBC in one week. Advised to take prednisone 40 mg daily for 3 more days. Advised to continue  inhalers and continue nebulization as needed.  Home Health:None. Equipment/Devices:None  Discharge Condition: Stable CODE STATUS:Full code Diet recommendation: Heart Healthy   Brief Summary / Hospital Course: This 75 years old female with PMH significant for asthma, hypertension, hyperlipidemia, rheumatoid arthritis presented in the ED with progressively worsening shortness of breath over the past 3 weeks which has gotten worse today.  She reports she was recently treated with prednisone for asthma and was given nebulizer treatment at home which did not relieve her symptoms.  Her symptoms acutely worsened yesterday and EMS was called,  upon EMS arrival she was in respiratory distress and was given Solu-Medrol, albuterol, DuoNeb, magnesium and was also noted to be lethargic.  She denies any chest pain, nausea, vomiting, fever, chills, history of VTE, left leg swelling.  Patient was found to be tachycardic, tachypneic, hypertensive in the ED and placed on BiPAP.  Patient is admitted for asthma exacerbation. She was admitted for asthma exacerbation and she was continued on IV Solu-Medrol, scheduled and as needed DuoNeb nebulizations.  She has significantly improved over the course of hospitalization.  Patient has ambulated in the hallway without any difficulty breathing.  Patient feels better and she wants to be discharged home.  Patient is being discharged home on prednisone for 3 more days to complete 5-day treatment.  She was managed for below problems during  hospitalization.  Discharge Diagnoses:  Principal Problem:   Acute respiratory failure with hypercapnia (HCC) Active Problems:   Asthma exacerbation   Essential hypertension   CKD (chronic kidney disease), stage III (HCC)   Hyperglycemia  Acute hypoxic and hypercapnic respiratory failure secondary to asthma exacerbation: Patient presented with worsening shortness of breath. VBG with elevated PCO2. Patient placed on BiPAP and doing much improved. Continue Breo ellipta, IV Solu-Medrol. Continue scheduled duo nebs and as needed albuterol. Continue supplemental oxygen to keep saturation above 92%. She is weaned down successfully to room air. Patient has ambulated in the hallway without any difficulty breathing and wants to be discharged.   Hypertension: Continue home amlodipine and Toprol-XL. Resume HCTZ and losartan,   CKD stage IIIb: Serum creatinine at baseline.   Leukocytosis could be secondary to steroids.   Rheumatoid arthritis :> stable: Currently off DM ARDS.   Hypokalemia: Replaced.  Continue to monitor    Discharge Instructions  Discharge Instructions     Call MD for:  difficulty breathing, headache or visual disturbances   Complete by: As directed    Call MD for:  persistant dizziness or light-headedness   Complete by: As directed    Call MD for:  persistant nausea and vomiting   Complete by: As directed    Diet - low sodium heart healthy   Complete by: As directed    Diet Carb Modified   Complete by: As directed    Discharge instructions   Complete by: As directed    Advised to follow-up with primary care physician in 1 week. Advised to take prednisone 40 mg daily for 3 more days Advised to follow-up with inhalers and continue nebulization  as needed.   Increase activity slowly   Complete by: As directed       Allergies as of 10/02/2020       Reactions   Latex Diarrhea, Rash           Medication List     STOP taking these medications     promethazine-dextromethorphan 6.25-15 MG/5ML syrup Commonly known as: PROMETHAZINE-DM       TAKE these medications    albuterol 108 (90 Base) MCG/ACT inhaler Commonly known as: ProAir HFA INHALE 2 PUFFS BY MOUTH EVERY 6 HOURS IF NEEDED What changed:  how much to take how to take this when to take this reasons to take this additional instructions   amLODipine 10 MG tablet Commonly known as: NORVASC Take 10 mg by mouth daily.   atorvastatin 40 MG tablet Commonly known as: LIPITOR Take 40 mg by mouth daily.   budesonide-formoterol 160-4.5 MCG/ACT inhaler Commonly known as: Symbicort Inhale 2 puffs then rinse mouth, twice daily   folic acid 1 MG tablet Commonly known as: FOLVITE Take 1 mg by mouth daily.   glipiZIDE 5 MG 24 hr tablet Commonly known as: GLUCOTROL XL Take 5 mg by mouth daily.   hydrochlorothiazide 25 MG tablet Commonly known as: HYDRODIURIL Take 25 mg by mouth daily.   memantine 10 MG tablet Commonly known as: NAMENDA Take 10 mg by mouth 2 (two) times daily.   predniSONE 20 MG tablet Commonly known as: DELTASONE Take 2 tablets (40 mg total) by mouth daily with breakfast for 3 days. Start taking on: October 03, 2020 What changed:  how much to take how to take this when to take this additional instructions   Stiolto Respimat 2.5-2.5 MCG/ACT Aers Generic drug: Tiotropium Bromide-Olodaterol Inhale 2 puffs into the lungs daily.   valsartan 320 MG tablet Commonly known as: DIOVAN Take 1 tablet by mouth daily.        Follow-up Information     Heather Baptist, PA-C Follow up in 3 day(s).   Specialty: Physician Assistant Contact information: [place] [address] [phone]                Allergies  Allergen Reactions   Latex Diarrhea and Rash         Consultations: None   Procedures/Studies: DG Chest Port 1 View  Result Date: 09/30/2020 CLINICAL DATA:  Respiratory distress EXAM: PORTABLE  CHEST 1 VIEW COMPARISON:  05/17/2020 FINDINGS: Normal heart size and mediastinal contours. No acute infiltrate or edema. Calcified pulmonary nodule on the left. Extensive artifact from EKG leads. No effusion or pneumothorax. No acute osseous findings. IMPRESSION: No active disease. Electronically Signed   By: Monte Fantasia M.D.   On: 09/30/2020 05:55       Subjective: Patient was seen and examined at bedside.  Overnight events noted.  Patient reports feeling much improved.  She wants to be discharged home.  She is not requiring oxygen.  Discharge Exam: Vitals:   10/02/20 0604 10/02/20 0828  BP: 138/67   Pulse: 64   Resp: 18   Temp: 98.3 F (36.8 C)   SpO2: 99% 95%   Vitals:   10/01/20 2021 10/01/20 2029 10/02/20 0604 10/02/20 0828  BP:  118/63 138/67   Pulse:  77 64   Resp:  20 18   Temp:  98.1 F (36.7 C) 98.3 F (36.8 C)   TempSrc:  Oral Oral   SpO2: 100% 100% 99% 95%  Weight:  Height:        General: Pt is alert, awake, not in acute distress Cardiovascular: RRR, S1/S2 +, no rubs, no gallops Respiratory: CTA bilaterally, no wheezing, no rhonchi Abdominal: Soft, NT, ND, bowel sounds + Extremities: no edema, no cyanosis    The results of significant diagnostics from this hospitalization (including imaging, microbiology, ancillary and laboratory) are listed below for reference.     Microbiology: Recent Results (from the past 240 hour(s))  Resp Panel by RT-PCR (Flu A&B, Covid) Nasopharyngeal Swab     Status: None   Collection Time: 09/30/20  5:22 AM   Specimen: Nasopharyngeal Swab; Nasopharyngeal(NP) swabs in vial transport medium  Result Value Ref Range Status   SARS Coronavirus 2 by RT PCR NEGATIVE NEGATIVE Final    Comment: (NOTE) SARS-CoV-2 target nucleic acids are NOT DETECTED.  The SARS-CoV-2 RNA is generally detectable in upper respiratory specimens during the acute phase of infection. The lowest concentration of SARS-CoV-2 viral copies this assay can  detect is 138 copies/mL. A negative result does not preclude SARS-Cov-2 infection and should not be used as the sole basis for treatment or other patient management decisions. A negative result may occur with  improper specimen collection/handling, submission of specimen other than nasopharyngeal swab, presence of viral mutation(s) within the areas targeted by this assay, and inadequate number of viral copies(<138 copies/mL). A negative result must be combined with clinical observations, patient history, and epidemiological information. The expected result is Negative.  Fact Sheet for Patients:  EntrepreneurPulse.com.au  Fact Sheet for Healthcare Providers:  IncredibleEmployment.be  This test is no t yet approved or cleared by the [place] FDA and  has been authorized for detection and/or diagnosis of SARS-CoV-2 by FDA under an Emergency Use Authorization (EUA). This EUA will remain  in effect (meaning this test can be used) for the duration of the COVID-19 declaration under Section 564(b)(1) of the Act, 21 U.S.C.section 360bbb-3(b)(1), unless the authorization is terminated  or revoked sooner.       Influenza A by PCR NEGATIVE NEGATIVE Final   Influenza B by PCR NEGATIVE NEGATIVE Final    Comment: (NOTE) The Xpert Xpress SARS-CoV-2/FLU/RSV plus assay is intended as an aid in the diagnosis of influenza from Nasopharyngeal swab specimens and should not be used as a sole basis for treatment. Nasal washings and aspirates are unacceptable for Xpert Xpress SARS-CoV-2/FLU/RSV testing.  Fact Sheet for Patients: EntrepreneurPulse.com.au  Fact Sheet for Healthcare Providers: IncredibleEmployment.be  This test is not yet approved or cleared by the [place] FDA and has been authorized for detection and/or diagnosis of SARS-CoV-2 by FDA under an Emergency Use Authorization (EUA). This EUA will remain in  effect (meaning this test can be used) for the duration of the COVID-19 declaration under Section 564(b)(1) of the Act, 21 U.S.C. section 360bbb-3(b)(1), unless the authorization is terminated or revoked.  Performed at [facility], [place] [address], [place], [place] [address]      Labs: BNP (last 3 results) No results for input(s): BNP in the last 8760 hours. Basic Metabolic Panel: Recent Labs  Lab 09/30/20 0522 10/01/20 0515 10/02/20 0508  NA 143 141 140  K 3.3* 3.4* 4.0  CL 108 110 112*  CO2 24 23 22   GLUCOSE 222* 175* 122*  BUN 30* 38* 45*  CREATININE 1.27* 1.24* 1.22*  CALCIUM 9.4 9.4 9.3   Liver Function Tests: No results for input(s): AST, ALT, ALKPHOS, BILITOT, PROT, ALBUMIN in the last 168 hours. No results for  input(s): LIPASE, AMYLASE in the last 168 hours. No results for input(s): AMMONIA in the last 168 hours. CBC: Recent Labs  Lab 09/30/20 0522 10/01/20 0515 10/02/20 0508  WBC 15.0* 21.1* 25.0*  NEUTROABS 10.2*  --   --   HGB 12.9 11.3* 10.7*  HCT 39.6 34.4* 32.6*  MCV 97.1 96.4 96.4  PLT 213 168 159   Cardiac Enzymes: No results for input(s): CKTOTAL, CKMB, CKMBINDEX, TROPONINI in the last 168 hours. BNP: Invalid input(s): POCBNP CBG: Recent Labs  Lab 10/01/20 1113 10/01/20 1634 10/01/20 2024 10/02/20 0742 10/02/20 1151  GLUCAP 240* 178* 200* 111* 185*   D-Dimer No results for input(s): DDIMER in the last 72 hours. Hgb A1c Recent Labs    09/30/20 0522  HGBA1C 5.4   Lipid Profile No results for input(s): CHOL, HDL, LDLCALC, TRIG, CHOLHDL, LDLDIRECT in the last 72 hours. Thyroid function studies No results for input(s): TSH, T4TOTAL, T3FREE, THYROIDAB in the last 72 hours.  Invalid input(s): FREET3 Anemia work up No results for input(s): VITAMINB12, FOLATE, FERRITIN, TIBC, IRON, RETICCTPCT in the last 72 hours. Urinalysis    Component Value Date/Time   COLORURINE YELLOW 01/04/2019 1314   APPEARANCEUR CLEAR  01/04/2019 1314   LABSPEC 1.025 01/04/2019 1314   PHURINE 5.5 01/04/2019 1314   GLUCOSEU NEGATIVE 01/04/2019 1314   HGBUR NEGATIVE 01/04/2019 1314   BILIRUBINUR NEGATIVE 01/04/2019 1314   KETONESUR NEGATIVE 01/04/2019 1314   PROTEINUR NEGATIVE 01/04/2019 1314   NITRITE NEGATIVE 01/04/2019 1314   LEUKOCYTESUR NEGATIVE 01/04/2019 1314   Sepsis Labs Invalid input(s): PROCALCITONIN,  WBC,  LACTICIDVEN Microbiology Recent Results (from the past 240 hour(s))  Resp Panel by RT-PCR (Flu A&B, Covid) Nasopharyngeal Swab     Status: None   Collection Time: 09/30/20  5:22 AM   Specimen: Nasopharyngeal Swab; Nasopharyngeal(NP) swabs in vial transport medium  Result Value Ref Range Status   SARS Coronavirus 2 by RT PCR NEGATIVE NEGATIVE Final    Comment: (NOTE) SARS-CoV-2 target nucleic acids are NOT DETECTED.  The SARS-CoV-2 RNA is generally detectable in upper respiratory specimens during the acute phase of infection. The lowest concentration of SARS-CoV-2 viral copies this assay can detect is 138 copies/mL. A negative result does not preclude SARS-Cov-2 infection and should not be used as the sole basis for treatment or other patient management decisions. A negative result may occur with  improper specimen collection/handling, submission of specimen other than nasopharyngeal swab, presence of viral mutation(s) within the areas targeted by this assay, and inadequate number of viral copies(<138 copies/mL). A negative result must be combined with clinical observations, patient history, and epidemiological information. The expected result is Negative.  Fact Sheet for Patients:  EntrepreneurPulse.com.au  Fact Sheet for Healthcare Providers:  IncredibleEmployment.be  This test is no t yet approved or cleared by the [place] FDA and  has been authorized for detection and/or diagnosis of SARS-CoV-2 by FDA under an Emergency Use Authorization (EUA).  This EUA will remain  in effect (meaning this test can be used) for the duration of the COVID-19 declaration under Section 564(b)(1) of the Act, 21 U.S.C.section 360bbb-3(b)(1), unless the authorization is terminated  or revoked sooner.       Influenza A by PCR NEGATIVE NEGATIVE Final   Influenza B by PCR NEGATIVE NEGATIVE Final    Comment: (NOTE) The Xpert Xpress SARS-CoV-2/FLU/RSV plus assay is intended as an aid in the diagnosis of influenza from Nasopharyngeal swab specimens and should not be used as a sole basis for treatment.  Nasal washings and aspirates are unacceptable for Xpert Xpress SARS-CoV-2/FLU/RSV testing.  Fact Sheet for Patients: EntrepreneurPulse.com.au  Fact Sheet for Healthcare Providers: IncredibleEmployment.be  This test is not yet approved or cleared by the [place] FDA and has been authorized for detection and/or diagnosis of SARS-CoV-2 by FDA under an Emergency Use Authorization (EUA). This EUA will remain in effect (meaning this test can be used) for the duration of the COVID-19 declaration under Section 564(b)(1) of the Act, 21 U.S.C. section 360bbb-3(b)(1), unless the authorization is terminated or revoked.  Performed at [facility], [place] [address], [place], [place] [address]      Time coordinating discharge: Over 30 minutes  SIGNED:   Shawna Clamp, MD  [facility] 10/02/2020, 12:27 PM Pager   If 7PM-7AM, please contact night-coverage [email or web address]

## 2020-10-22 ENCOUNTER — Other Ambulatory Visit (HOSPITAL_BASED_OUTPATIENT_CLINIC_OR_DEPARTMENT_OTHER): Payer: Self-pay | Admitting: Gastroenterology

## 2020-10-22 ENCOUNTER — Other Ambulatory Visit: Payer: Self-pay | Admitting: Gastroenterology

## 2020-10-22 DIAGNOSIS — R935 Abnormal findings on diagnostic imaging of other abdominal regions, including retroperitoneum: Secondary | ICD-10-CM

## 2020-10-22 DIAGNOSIS — E1122 Type 2 diabetes mellitus with diabetic chronic kidney disease: Secondary | ICD-10-CM | POA: Diagnosis not present

## 2020-10-22 DIAGNOSIS — R1084 Generalized abdominal pain: Secondary | ICD-10-CM | POA: Diagnosis not present

## 2020-10-22 DIAGNOSIS — I129 Hypertensive chronic kidney disease with stage 1 through stage 4 chronic kidney disease, or unspecified chronic kidney disease: Secondary | ICD-10-CM | POA: Diagnosis not present

## 2020-10-22 DIAGNOSIS — E1169 Type 2 diabetes mellitus with other specified complication: Secondary | ICD-10-CM | POA: Diagnosis not present

## 2020-10-22 DIAGNOSIS — N1831 Chronic kidney disease, stage 3a: Secondary | ICD-10-CM | POA: Diagnosis not present

## 2020-10-22 DIAGNOSIS — E7849 Other hyperlipidemia: Secondary | ICD-10-CM | POA: Diagnosis not present

## 2020-10-22 DIAGNOSIS — J449 Chronic obstructive pulmonary disease, unspecified: Secondary | ICD-10-CM | POA: Diagnosis not present

## 2020-11-06 ENCOUNTER — Other Ambulatory Visit: Payer: Self-pay

## 2020-11-06 ENCOUNTER — Encounter (HOSPITAL_BASED_OUTPATIENT_CLINIC_OR_DEPARTMENT_OTHER): Payer: Self-pay | Admitting: Radiology

## 2020-11-06 ENCOUNTER — Ambulatory Visit (HOSPITAL_BASED_OUTPATIENT_CLINIC_OR_DEPARTMENT_OTHER)
Admission: RE | Admit: 2020-11-06 | Discharge: 2020-11-06 | Disposition: A | Discharge status: Home / Self Care | Payer: Medicare Other | Source: Ambulatory Visit | Attending: Gastroenterology | Admitting: Gastroenterology

## 2020-11-06 ENCOUNTER — Encounter (HOSPITAL_BASED_OUTPATIENT_CLINIC_OR_DEPARTMENT_OTHER): Payer: Self-pay

## 2020-11-06 DIAGNOSIS — R1084 Generalized abdominal pain: Secondary | ICD-10-CM

## 2020-11-06 DIAGNOSIS — R935 Abnormal findings on diagnostic imaging of other abdominal regions, including retroperitoneum: Secondary | ICD-10-CM

## 2020-11-06 MED ORDER — IOHEXOL 300 MG/ML  SOLN: 80.0000 mL | Freq: Once | INTRAMUSCULAR | Status: DC | PRN

## 2020-11-17 ENCOUNTER — Emergency Department (HOSPITAL_COMMUNITY): Payer: Medicare Other

## 2020-11-17 ENCOUNTER — Emergency Department (HOSPITAL_COMMUNITY)
Admission: EM | Admit: 2020-11-17 | Discharge: 2020-11-17 | Disposition: A | Discharge status: Home / Self Care | Payer: Medicare Other | Attending: Emergency Medicine | Admitting: Emergency Medicine

## 2020-11-17 ENCOUNTER — Encounter (HOSPITAL_COMMUNITY): Payer: Self-pay | Admitting: *Deleted

## 2020-11-17 ENCOUNTER — Other Ambulatory Visit: Payer: Self-pay

## 2020-11-17 DIAGNOSIS — I129 Hypertensive chronic kidney disease with stage 1 through stage 4 chronic kidney disease, or unspecified chronic kidney disease: Secondary | ICD-10-CM | POA: Insufficient documentation

## 2020-11-17 DIAGNOSIS — Z20822 Contact with and (suspected) exposure to covid-19: Secondary | ICD-10-CM | POA: Insufficient documentation

## 2020-11-17 DIAGNOSIS — J449 Chronic obstructive pulmonary disease, unspecified: Secondary | ICD-10-CM | POA: Diagnosis not present

## 2020-11-17 DIAGNOSIS — I160 Hypertensive urgency: Secondary | ICD-10-CM | POA: Diagnosis not present

## 2020-11-17 DIAGNOSIS — Z91040 Latex allergy status: Secondary | ICD-10-CM | POA: Insufficient documentation

## 2020-11-17 DIAGNOSIS — J4541 Moderate persistent asthma with (acute) exacerbation: Secondary | ICD-10-CM | POA: Insufficient documentation

## 2020-11-17 DIAGNOSIS — N183 Chronic kidney disease, stage 3 unspecified: Secondary | ICD-10-CM | POA: Insufficient documentation

## 2020-11-17 DIAGNOSIS — Z79899 Other long term (current) drug therapy: Secondary | ICD-10-CM | POA: Diagnosis not present

## 2020-11-17 DIAGNOSIS — R0602 Shortness of breath: Secondary | ICD-10-CM | POA: Diagnosis present

## 2020-11-17 LAB — COMPREHENSIVE METABOLIC PANEL
ALT: 28 U/L (ref 0–44)
AST: 28 U/L (ref 15–41)
Albumin: 4.2 g/dL (ref 3.5–5.0)
Alkaline Phosphatase: 61 U/L (ref 38–126)
Anion gap: 11 (ref 5–15)
BUN: 15 mg/dL (ref 8–23)
CO2: 23 mmol/L (ref 22–32)
Calcium: 9.8 mg/dL (ref 8.9–10.3)
Chloride: 107 mmol/L (ref 98–111)
Creatinine, Ser: 1.06 mg/dL — ABNORMAL HIGH (ref 0.44–1.00)
GFR, Estimated: 54 mL/min — ABNORMAL LOW (ref >60)
Glucose, Bld: 122 mg/dL — ABNORMAL HIGH (ref 70–99)
Potassium: 3.2 mmol/L — ABNORMAL LOW (ref 3.5–5.1)
Sodium: 141 mmol/L (ref 135–145)
Total Bilirubin: 0.8 mg/dL (ref 0.3–1.2)
Total Protein: 7.9 g/dL (ref 6.5–8.1)

## 2020-11-17 LAB — RESP PANEL BY RT-PCR (FLU A&B, COVID) ARPGX2
Influenza A by PCR: NEGATIVE
Influenza B by PCR: NEGATIVE
SARS Coronavirus 2 by RT PCR: NEGATIVE

## 2020-11-17 LAB — CBC WITH DIFFERENTIAL/PLATELET
Abs Immature Granulocytes: 0.02 10*3/uL (ref 0.00–0.07)
Basophils Absolute: 0 10*3/uL (ref 0.0–0.1)
Basophils Relative: 1 %
Eosinophils Absolute: 0.3 10*3/uL (ref 0.0–0.5)
Eosinophils Relative: 4 %
HCT: 41.4 % (ref 36.0–46.0)
Hemoglobin: 13.9 g/dL (ref 12.0–15.0)
Immature Granulocytes: 0 %
Lymphocytes Relative: 22 %
Lymphs Abs: 1.4 10*3/uL (ref 0.7–4.0)
MCH: 31 pg (ref 26.0–34.0)
MCHC: 33.6 g/dL (ref 30.0–36.0)
MCV: 92.2 fL (ref 80.0–100.0)
Monocytes Absolute: 0.4 10*3/uL (ref 0.1–1.0)
Monocytes Relative: 7 %
Neutro Abs: 4.2 10*3/uL (ref 1.7–7.7)
Neutrophils Relative %: 66 %
Platelets: 210 10*3/uL (ref 150–400)
RBC: 4.49 MIL/uL (ref 3.87–5.11)
RDW: 12.8 % (ref 11.5–15.5)
WBC: 6.3 10*3/uL (ref 4.0–10.5)
nRBC: 0 % (ref 0.0–0.2)

## 2020-11-17 LAB — BRAIN NATRIURETIC PEPTIDE: B Natriuretic Peptide: 52 pg/mL (ref 0.0–100.0)

## 2020-11-17 LAB — POC SARS CORONAVIRUS 2 AG -  ED: SARSCOV2ONAVIRUS 2 AG: NEGATIVE

## 2020-11-17 MED ORDER — PREDNISONE 10 MG PO TABS: 50.0000 mg | ORAL_TABLET | Freq: Every day | ORAL | 0 refills | Status: DC

## 2020-11-17 MED ORDER — METHYLPREDNISOLONE SODIUM SUCC 125 MG IJ SOLR
125.0000 mg | Freq: Once | INTRAMUSCULAR | Status: AC
  Administered 2020-11-17: 125 mg via INTRAVENOUS
  Filled 2020-11-17: qty 2

## 2020-11-17 MED ORDER — ALBUTEROL SULFATE (2.5 MG/3ML) 0.083% IN NEBU
2.5000 mg | INHALATION_SOLUTION | Freq: Once | RESPIRATORY_TRACT | Status: DC
  Filled 2020-11-17: qty 3

## 2020-11-17 MED ORDER — ALBUTEROL SULFATE HFA 108 (90 BASE) MCG/ACT IN AERS
2.0000 | INHALATION_SPRAY | RESPIRATORY_TRACT | Status: DC
  Administered 2020-11-17: 2 via RESPIRATORY_TRACT

## 2020-11-17 MED ORDER — ALBUTEROL (5 MG/ML) CONTINUOUS INHALATION SOLN
10.0000 mg/h | INHALATION_SOLUTION | Freq: Once | RESPIRATORY_TRACT | Status: AC
  Administered 2020-11-17: 10 mg/h via RESPIRATORY_TRACT
  Filled 2020-11-17: qty 20

## 2020-11-17 MED ORDER — ALBUTEROL SULFATE HFA 108 (90 BASE) MCG/ACT IN AERS: 2.0000 | INHALATION_SPRAY | RESPIRATORY_TRACT | Status: DC | PRN

## 2020-11-17 NOTE — ED Provider Notes (Addendum)
[facility] DEPT Provider Note   CSN: [id number] Arrival date & time: 11/17/20  [id number]     History Chief Complaint  Patient presents with   Shortness of Breath    Heather Austin is a 75 y.o. female.  HPI     75 years old female with PMH significant for asthma, hypertension, hyperlipidemia, rheumatoid arthritis presented in the ED with progressively worsening shortness of breath over the past 2-3 days.  Patient reports that she has been wheezing and having shortness of breath.  Shortness of breath is present at rest and worse with exertion.  She has mild cough.  She has no new orthopnea or PND.  She denies any leg swelling and there is no history of PE, DVT.  Patient suspects the hot weather to be the cause.  She has taken inhaler at home with transient relief.  She was admitted to the hospital sometime last month for asthma exacerbation.  Symptoms are similar.  Review of system is negative for chest pain.  Past Medical History:  Diagnosis Date   Asthma    Since age 69   HTN (hypertension), benign    Hyperlipidemia    Multiple joint pain    Rheumatoid arthritis [facility])     Patient Active Problem List   Diagnosis Date Noted   Asthma exacerbation 09/30/2020   Acute respiratory failure with hypercapnia ([place]) 09/30/2020   Essential hypertension 09/30/2020   CKD (chronic kidney disease), stage III ([place]) 09/30/2020   Hyperglycemia 09/30/2020   Moderate intermittent asthma with acute exacerbation 06/04/2016   Rheumatoid arthritis ([place]) 06/04/2016   DOE (dyspnea on exertion) 03/02/2013   Asthma with COPD ([place]) 03/13/2012    Past Surgical History:  Procedure Laterality Date   CATARACT EXTRACTION Bilateral      OB History   No obstetric history on file.     Family History  Problem Relation Age of Onset   Hypertension Mother    Asthma Sister    Allergies Son    Allergies Daughter     Social History   Tobacco Use   Smoking status: Never    Smokeless tobacco: Never  Substance Use Topics   Alcohol use: No   Drug use: No    Home Medications Prior to Admission medications   Medication Sig Start Date End Date Taking? Authorizing Provider  predniSONE (DELTASONE) 10 MG tablet Take 5 tablets (50 mg total) by mouth daily. 11/17/20  Yes Davia Smyre, MD  albuterol (PROAIR HFA) 108 (90 Base) MCG/ACT inhaler INHALE 2 PUFFS BY MOUTH EVERY 6 HOURS IF NEEDED 09/20/18   Young, Tarri Fuller D, MD  amLODipine (NORVASC) 10 MG tablet Take 10 mg by mouth daily. 03/07/18   [provider]  atorvastatin (LIPITOR) 40 MG tablet Take 40 mg by mouth daily. 03/09/18   [provider]  budesonide-formoterol (SYMBICORT) 160-4.5 MCG/ACT inhaler Inhale 2 puffs then rinse mouth, twice daily 10/02/20   Shawna Clamp, MD  folic acid (FOLVITE) 1 MG tablet Take 1 mg by mouth daily.    [provider]  glipiZIDE (GLUCOTROL XL) 5 MG 24 hr tablet Take 5 mg by mouth daily. 08/08/20   [provider]  hydrochlorothiazide (HYDRODIURIL) 25 MG tablet Take 25 mg by mouth daily. 03/28/18   [provider]  memantine (NAMENDA) 10 MG tablet Take 10 mg by mouth 2 (two) times daily. 07/21/20   [provider]  STIOLTO RESPIMAT 2.5-2.5 MCG/ACT AERS Inhale 2 puffs into the lungs daily. 09/08/20  [provider]  valsartan (DIOVAN) 320 MG tablet Take 1 tablet by mouth daily. 06/04/18   [provider]    Allergies    Latex  Review of Systems   Review of Systems  Constitutional:  Positive for activity change.  Respiratory:  Positive for shortness of breath and wheezing.   Cardiovascular:  Negative for chest pain.  Gastrointestinal:  Negative for nausea and vomiting.  Neurological:  Negative for syncope.  All other systems reviewed and are negative.  Physical Exam Updated Vital Signs BP (!) 214/112   Pulse (!) 51   Temp 98.5 F (36.9 C) (Oral)   Resp (!) 22   Ht '5\' 6"'$  (1.676 m)   Wt 54.4 kg   SpO2  97%   BMI 19.37 kg/m   Physical Exam Vitals and nursing note reviewed.  Constitutional:      Appearance: She is well-developed.  HENT:     Head: Atraumatic.  Cardiovascular:     Rate and Rhythm: Normal rate.  Pulmonary:     Effort: Pulmonary effort is normal. Tachypnea present.     Breath sounds: Wheezing present. No decreased breath sounds.  Musculoskeletal:     Cervical back: Normal range of motion and neck supple.     Right lower leg: No tenderness. No edema.     Left lower leg: No tenderness. No edema.  Skin:    General: Skin is warm and dry.  Neurological:     Mental Status: She is alert and oriented to person, place, and time.    ED Results / Procedures / Treatments   Labs (all labs ordered are listed, but only abnormal results are displayed) Labs Reviewed  COMPREHENSIVE METABOLIC PANEL - Abnormal; Notable for the following components:      Result Value   Potassium 3.2 (*)    Glucose, Bld 122 (*)    Creatinine, Ser 1.06 (*)    GFR, Estimated 54 (*)    All other components within normal limits  RESP PANEL BY RT-PCR (FLU A&B, COVID) ARPGX2  CBC WITH DIFFERENTIAL/PLATELET  BRAIN NATRIURETIC PEPTIDE  POC SARS CORONAVIRUS 2 AG -  ED    EKG None  Radiology DG Chest Port 1 View  Result Date: 11/17/2020 CLINICAL DATA:  Cough EXAM: PORTABLE CHEST 1 VIEW COMPARISON:  09/30/2020 FINDINGS: The heart size and mediastinal contours are within normal limits. Both lungs are clear. The visualized skeletal structures are unremarkable. IMPRESSION: Negative Electronically Signed   By: Rolm Baptise M.D.   On: 11/17/2020 10:55    Procedures .Critical Care  Date/Time: 11/17/2020 3:12 PM Performed by: Varney Biles, MD Authorized by: Varney Biles, MD   Critical care provider statement:    Critical care time (minutes):  28   Critical care was necessary to treat or prevent imminent or life-threatening deterioration of the following conditions:  Respiratory failure    Critical care was time spent personally by me on the following activities:  Discussions with consultants, evaluation of patient's response to treatment, examination of patient, ordering and performing treatments and interventions, ordering and review of laboratory studies, ordering and review of radiographic studies, pulse oximetry, re-evaluation of patient's condition, obtaining history from patient or surrogate and review of old charts   Medications Ordered in ED Medications  albuterol (VENTOLIN HFA) 108 (90 Base) MCG/ACT inhaler 2 puff (has no administration in time range)  methylPREDNISolone sodium succinate (SOLU-MEDROL) 125 mg/2 mL injection 125 mg (125 mg Intravenous Given 11/17/20 1216)  albuterol (PROVENTIL,VENTOLIN) solution continuous neb (  10 mg/hr Nebulization Given 11/17/20 1307)    ED Course  I have reviewed the triage vital signs and the nursing notes.  Pertinent labs & imaging results that were available during my care of the patient were reviewed by me and considered in my medical decision making (see chart for details).  Clinical Course as of 11/17/20 1512  Mon Nov 17, 2020  1303 Patient reassessed on 2 separate occasions.  Still wheezing, but better.  Her COVID-19 test is negative.  We will order nebulizer treatment and reassess.  In the interim, her labs are reassuring.  BNP is normal.  X-ray is reassuring.  Negative COVID-19 test.  Will reassess after nebulizer treatment to see if she needs admission. [AN]  1504 Reassessed post hour-long nebulizer treatment.  Her wheezing has improved significantly.  There is only mild end expiratory wheezing on the upper lung fields now.  Patient wants to go home.  Her blood pressure is still elevated.  She reports that her BP is normally quite well-maintained.  I reviewed her vital signs during her recent admission, and they were quite normal.  I do not think her shortness of breath today was likely from the elevated blood pressure as there was  pronounced wheezing and the x-ray did not reveal any pulmonary edema, and she improved with bronchodilators.  However we have discussed strict ER return precautions for her and she will come back if she starts having chest pain, worsening shortness of breath, strokelike symptoms severe headaches.  She will also record her BP and follow-up with her PCP in 3 to 5 days. [AN]    Clinical Course User Index [AN] Varney Biles, MD   MDM Rules/Calculators/A&P                            75 year old female comes in a chief complaint of shortness of breath.  She is noted to have diffuse wheezing along with tachypnea.  Suspect acute asthma exacerbation.  COVID-19 is possible, order sent.  Other possibilities include CHF.  PE considered, but deemed to be less likely at this time given the exam finding and history of asthma exacerbation with similar presentation.   Heather Austin was evaluated in Emergency Department on 11/17/2020 for the symptoms described in the history of present illness. She was evaluated in the context of the global COVID-19 pandemic, which necessitated consideration that the patient might be at risk for infection with the SARS-CoV-2 virus that causes COVID-19. Institutional protocols and algorithms that pertain to the evaluation of patients at risk for COVID-19 are in a state of rapid change based on information released by regulatory bodies including the CDC and federal and state organizations. These policies and algorithms were followed during the patient's care in the ED.   Final Clinical Impression(s) / ED Diagnoses Final diagnoses:  Moderate persistent asthma with acute exacerbation  Hypertensive urgency    Rx / DC Orders ED Discharge Orders          Ordered    predniSONE (DELTASONE) 10 MG tablet  Daily        11/17/20 1503                Varney Biles, MD 11/17/20 1513

## 2020-11-17 NOTE — Discharge Instructions (Addendum)
We saw you in the ER for your asthma related complains. We gave you some breathing treatments in the ER, and seems like your symptoms have improved.  Please take albuterol as needed every 4 hours for the next 2 days. Please take the medications prescribed. Please refrain from smoking or smoke exposure.  Your blood pressure was noted to be elevated.  Unclear if this is result of getting multiple treatments here.  Measure and record your BP as discussed pursing in the morning and right before you go to bed. Please see a primary care doctor in 1 week if the BP remains high.  If you start developing chest pain, worsening shortness of breath despite breathing treatments, severe headache or any new neurologic symptoms return to the ER immediately.

## 2020-11-17 NOTE — ED Triage Notes (Signed)
Pt with hx of asthma complains of shortness of breath and wheezing x 2 days. She has been using inhaler w/o relief

## 2020-11-27 ENCOUNTER — Other Ambulatory Visit: Payer: Self-pay

## 2020-11-27 ENCOUNTER — Ambulatory Visit (HOSPITAL_COMMUNITY)
Admission: RE | Admit: 2020-11-27 | Discharge: 2020-11-27 | Disposition: A | Discharge status: Home / Self Care | Payer: Medicare Other | Source: Ambulatory Visit | Attending: Gastroenterology | Admitting: Gastroenterology

## 2020-11-27 DIAGNOSIS — R1084 Generalized abdominal pain: Secondary | ICD-10-CM | POA: Diagnosis present

## 2020-11-27 DIAGNOSIS — R935 Abnormal findings on diagnostic imaging of other abdominal regions, including retroperitoneum: Secondary | ICD-10-CM | POA: Insufficient documentation

## 2020-11-27 IMAGING — CT CT ABD-PELV W/ CM
2 of 5 series · 16 of 46 positions shown, 18 images · IV contrast (APPLIED)
Comparison: [DATE]

CLINICAL DATA: Generalized abdominal pain.  Follow-up mesenteritis.

EXAM:
CT ABDOMEN AND PELVIS WITH CONTRAST
TECHNIQUE: Multidetector CT imaging of the abdomen and pelvis was performed
using the standard protocol following bolus administration of
intravenous contrast.
CONTRAST:  85mL OMNIPAQUE IOHEXOL 350 MG/ML SOLN

[Series 2: axial st · axial · 0.79mm/px · z∈[-642,-237]mm · 13 of 93 slices shown, 15 images]
[im 6/93  soft-tissue]
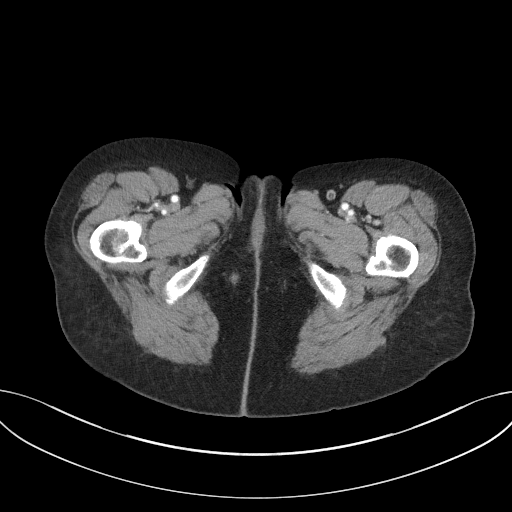
[im 6/93  bone]
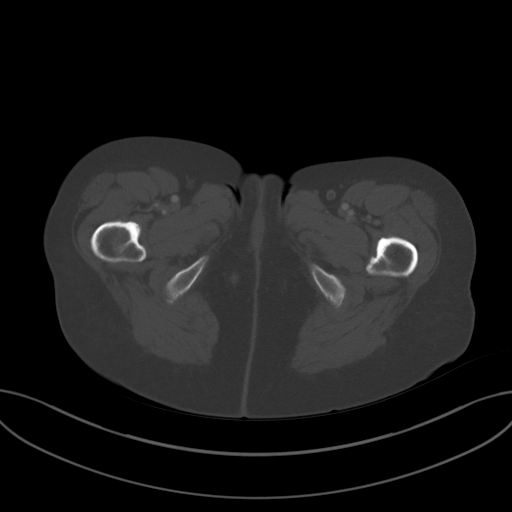
[im 12/93  soft-tissue]
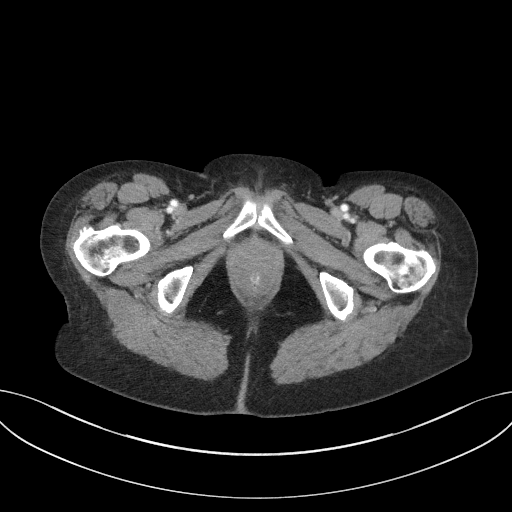
[im 18/93  soft-tissue]
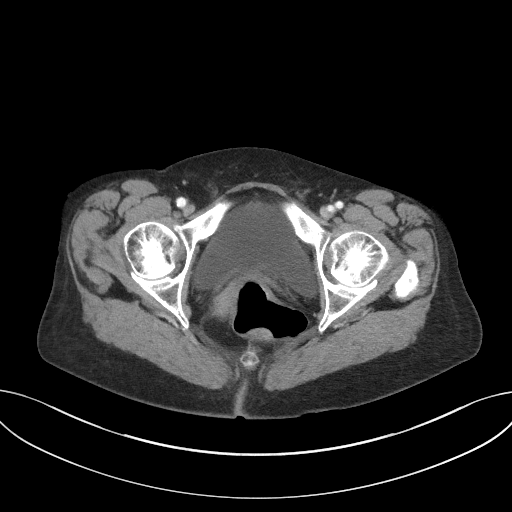
[im 29/93  soft-tissue]
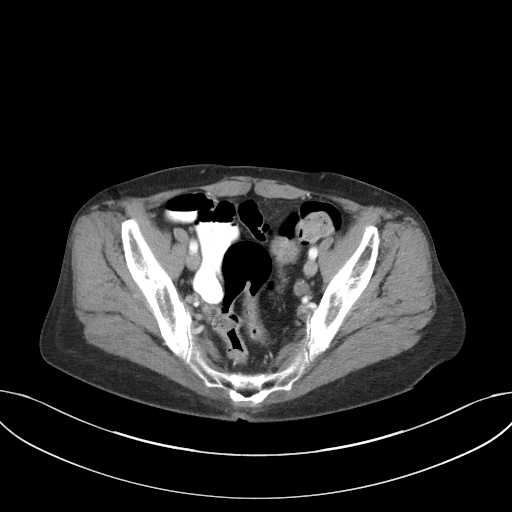
[im 35/93  soft-tissue]
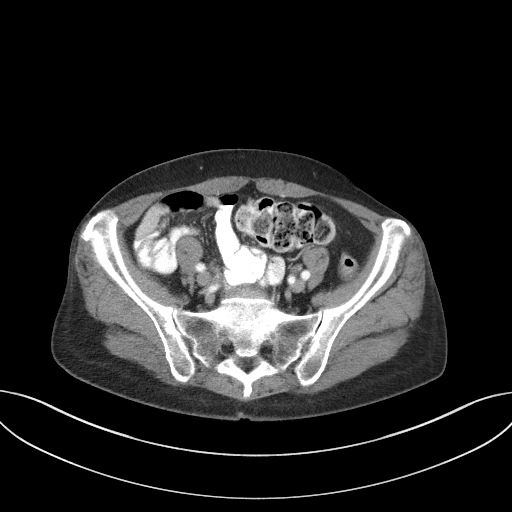
[im 41/93  soft-tissue]
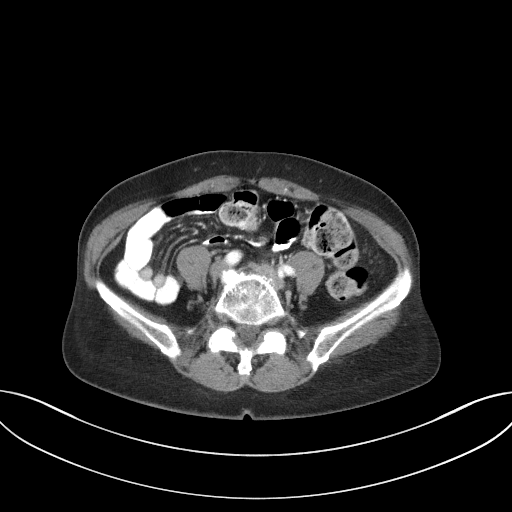
[im 47/93  soft-tissue]
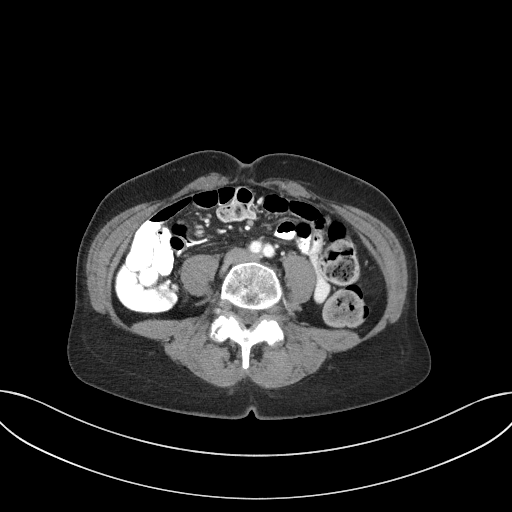
[im 52/93  soft-tissue]
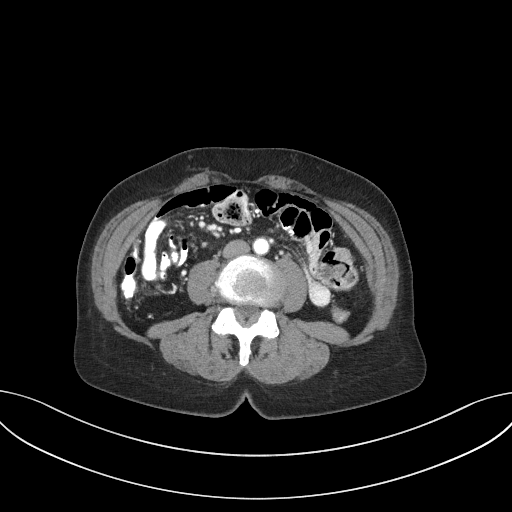
[im 58/93  soft-tissue]
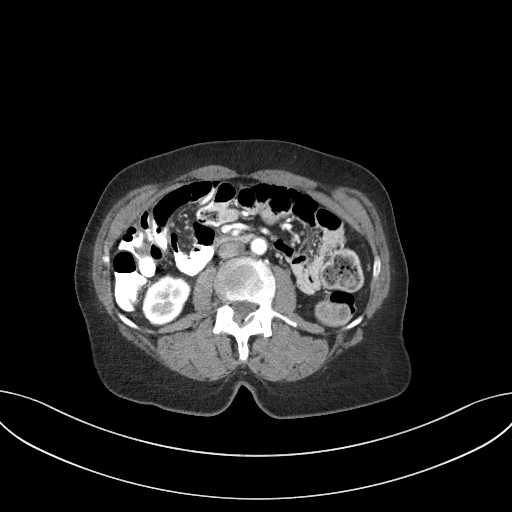
[im 58/93  bone]
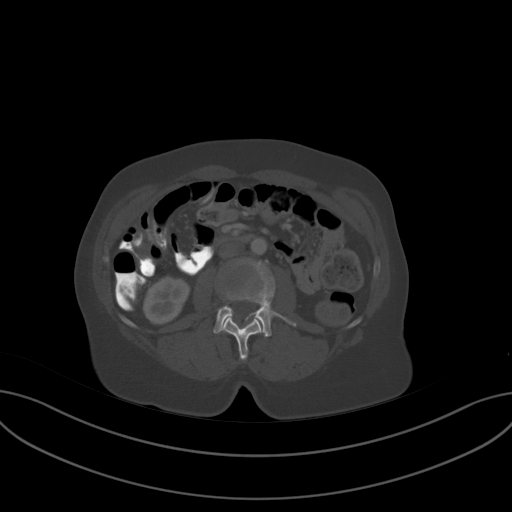
[im 64/93  soft-tissue]
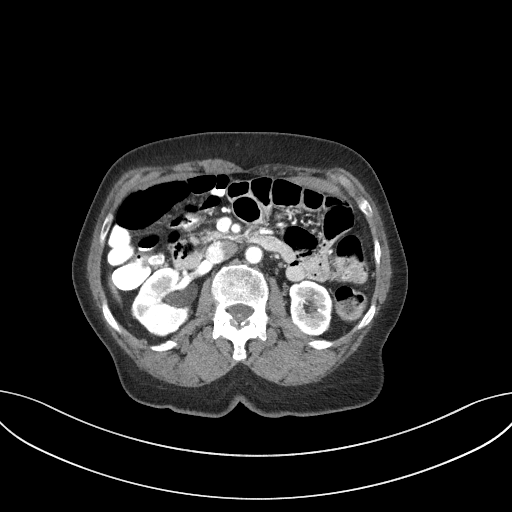
[im 75/93  soft-tissue]
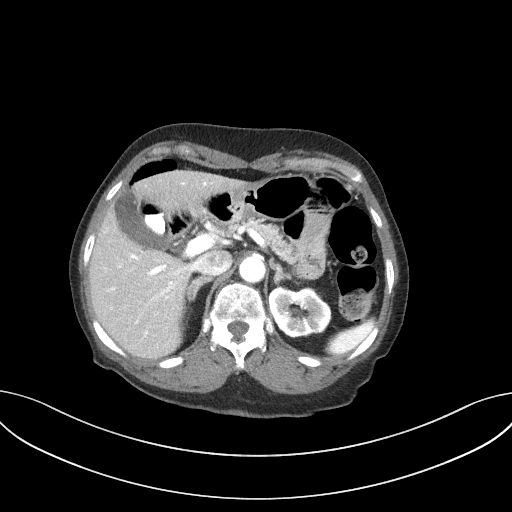
[im 81/93  soft-tissue]
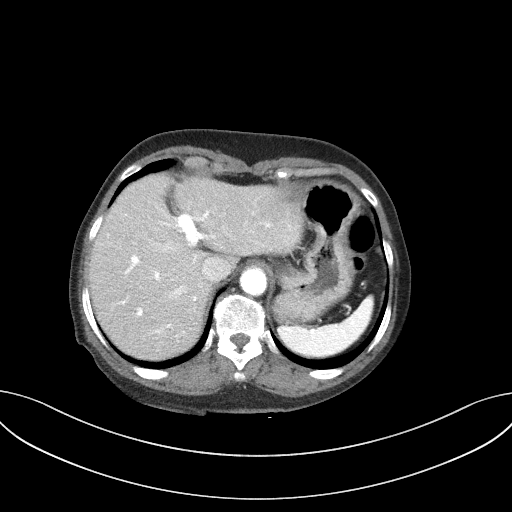
[im 87/93  soft-tissue]
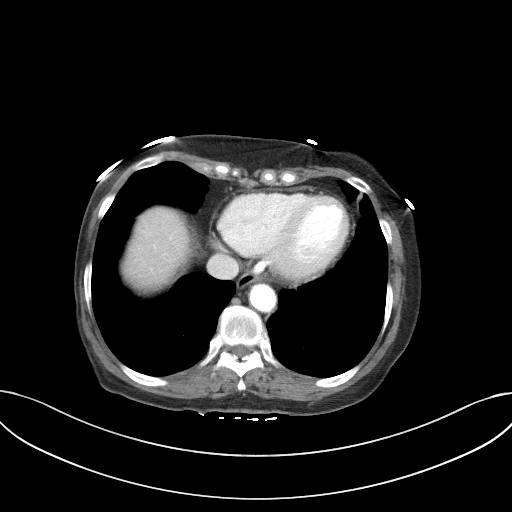

[Series 5: coronal st · coronal · 0.74mm/px · 3 of 89 slices shown]
[im 30/89  soft-tissue]
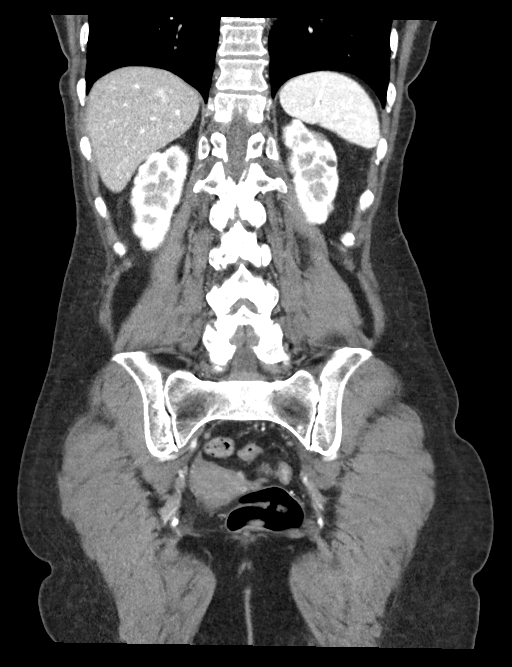
[im 40/89  soft-tissue]
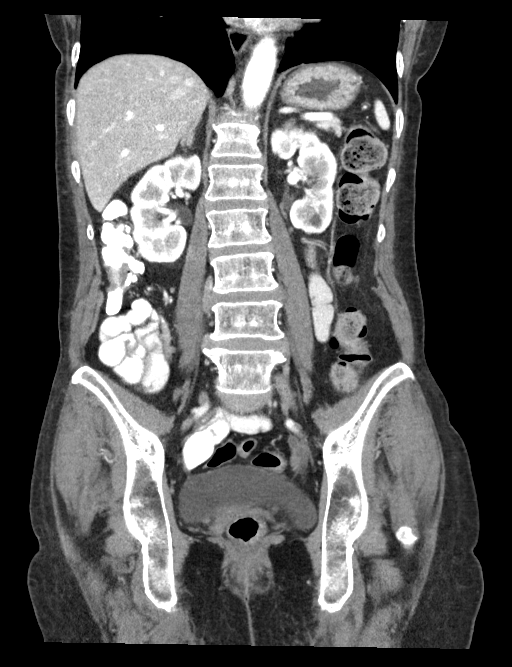
[im 49/89  soft-tissue]
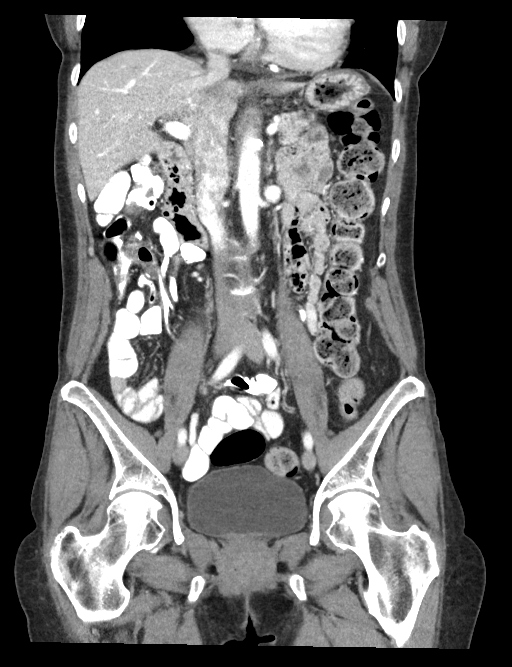

[16 of 46 positions shown; findings below may reference images not displayed]

FINDINGS: Lower Chest: No acute findings.

Hepatobiliary: No hepatic masses identified. Gallbladder is
unremarkable. No evidence of biliary ductal dilatation.

Pancreas:  No mass or inflammatory changes.

Spleen: Within normal limits in size and appearance.

Adrenals/Urinary Tract: No masses identified. Stable small cyst in
lower pole of left kidney. No evidence of ureteral calculi or
hydronephrosis. Unremarkable unopacified urinary bladder.

Stomach/Bowel: No evidence of obstruction, inflammatory process or
abnormal fluid collections. Normal appendix visualized.

Vascular/Lymphatic: No pathologically enlarged lymph nodes. No acute
vascular findings.

Reproductive: Several small partially calcified uterine fibroids
remains stable. Adnexal regions are unremarkable.

Other: Previously seen ill-defined soft tissue density and fluid in
the central small bowel mesentery has resolved since previous study.

Musculoskeletal:  No suspicious bone lesions identified.
IMPRESSION: Resolution of central mesenteric soft tissue density and fluid since
prior study. No acute findings.

Stable small uterine fibroids.

## 2020-11-27 MED ORDER — IOHEXOL 9 MG/ML PO SOLN
500.0000 mL | ORAL | Status: AC
  Administered 2020-11-27 (×2): 500 mL via ORAL

## 2020-11-27 MED ORDER — IOHEXOL 9 MG/ML PO SOLN
ORAL | Status: AC
  Filled 2020-11-27: qty 1000

## 2020-11-27 MED ORDER — IOHEXOL 350 MG/ML SOLN
85.0000 mL | Freq: Once | INTRAVENOUS | Status: AC | PRN
  Administered 2020-11-27: 85 mL via INTRAVENOUS

## 2021-01-12 ENCOUNTER — Observation Stay (HOSPITAL_COMMUNITY)
Admission: EM | Admit: 2021-01-12 | Discharge: 2021-01-13 | Disposition: A | Discharge status: Home / Self Care | Payer: Medicare Other | Attending: Internal Medicine | Admitting: Internal Medicine

## 2021-01-12 ENCOUNTER — Other Ambulatory Visit: Payer: Self-pay

## 2021-01-12 ENCOUNTER — Emergency Department (HOSPITAL_COMMUNITY): Payer: Medicare Other

## 2021-01-12 ENCOUNTER — Encounter (HOSPITAL_COMMUNITY): Payer: Self-pay

## 2021-01-12 DIAGNOSIS — I129 Hypertensive chronic kidney disease with stage 1 through stage 4 chronic kidney disease, or unspecified chronic kidney disease: Secondary | ICD-10-CM | POA: Diagnosis not present

## 2021-01-12 DIAGNOSIS — I10 Essential (primary) hypertension: Secondary | ICD-10-CM | POA: Diagnosis present

## 2021-01-12 DIAGNOSIS — Z20822 Contact with and (suspected) exposure to covid-19: Secondary | ICD-10-CM | POA: Diagnosis not present

## 2021-01-12 DIAGNOSIS — Z91040 Latex allergy status: Secondary | ICD-10-CM | POA: Diagnosis not present

## 2021-01-12 DIAGNOSIS — J45901 Unspecified asthma with (acute) exacerbation: Secondary | ICD-10-CM | POA: Diagnosis present

## 2021-01-12 DIAGNOSIS — N183 Chronic kidney disease, stage 3 unspecified: Secondary | ICD-10-CM | POA: Insufficient documentation

## 2021-01-12 DIAGNOSIS — Z7984 Long term (current) use of oral hypoglycemic drugs: Secondary | ICD-10-CM | POA: Insufficient documentation

## 2021-01-12 DIAGNOSIS — R739 Hyperglycemia, unspecified: Secondary | ICD-10-CM

## 2021-01-12 DIAGNOSIS — R0602 Shortness of breath: Secondary | ICD-10-CM | POA: Diagnosis present

## 2021-01-12 DIAGNOSIS — J4541 Moderate persistent asthma with (acute) exacerbation: Secondary | ICD-10-CM | POA: Diagnosis not present

## 2021-01-12 DIAGNOSIS — J449 Chronic obstructive pulmonary disease, unspecified: Secondary | ICD-10-CM | POA: Diagnosis not present

## 2021-01-12 DIAGNOSIS — Z79899 Other long term (current) drug therapy: Secondary | ICD-10-CM | POA: Insufficient documentation

## 2021-01-12 LAB — BASIC METABOLIC PANEL
Anion gap: 9 (ref 5–15)
BUN: 26 mg/dL — ABNORMAL HIGH (ref 8–23)
CO2: 26 mmol/L (ref 22–32)
Calcium: 9.8 mg/dL (ref 8.9–10.3)
Chloride: 105 mmol/L (ref 98–111)
Creatinine, Ser: 1.29 mg/dL — ABNORMAL HIGH (ref 0.44–1.00)
GFR, Estimated: 43 mL/min — ABNORMAL LOW (ref >60)
Glucose, Bld: 100 mg/dL — ABNORMAL HIGH (ref 70–99)
Potassium: 3.3 mmol/L — ABNORMAL LOW (ref 3.5–5.1)
Sodium: 140 mmol/L (ref 135–145)

## 2021-01-12 LAB — RESP PANEL BY RT-PCR (FLU A&B, COVID) ARPGX2
Influenza A by PCR: NEGATIVE
Influenza B by PCR: NEGATIVE
SARS Coronavirus 2 by RT PCR: NEGATIVE

## 2021-01-12 LAB — CBC WITH DIFFERENTIAL/PLATELET
Abs Immature Granulocytes: 0.02 10*3/uL (ref 0.00–0.07)
Basophils Absolute: 0 10*3/uL (ref 0.0–0.1)
Basophils Relative: 1 %
Eosinophils Absolute: 0.2 10*3/uL (ref 0.0–0.5)
Eosinophils Relative: 4 %
HCT: 43.5 % (ref 36.0–46.0)
Hemoglobin: 14.3 g/dL (ref 12.0–15.0)
Immature Granulocytes: 0 %
Lymphocytes Relative: 21 %
Lymphs Abs: 1.2 10*3/uL (ref 0.7–4.0)
MCH: 30.8 pg (ref 26.0–34.0)
MCHC: 32.9 g/dL (ref 30.0–36.0)
MCV: 93.8 fL (ref 80.0–100.0)
Monocytes Absolute: 0.5 10*3/uL (ref 0.1–1.0)
Monocytes Relative: 9 %
Neutro Abs: 3.7 10*3/uL (ref 1.7–7.7)
Neutrophils Relative %: 65 %
Platelets: 206 10*3/uL (ref 150–400)
RBC: 4.64 MIL/uL (ref 3.87–5.11)
RDW: 13.1 % (ref 11.5–15.5)
WBC: 5.7 10*3/uL (ref 4.0–10.5)
nRBC: 0 % (ref 0.0–0.2)

## 2021-01-12 LAB — BRAIN NATRIURETIC PEPTIDE: B Natriuretic Peptide: 21.1 pg/mL (ref 0.0–100.0)

## 2021-01-12 IMAGING — DX DG CHEST 2V
2 series · 2 of 2 positions shown · non-contrast
Comparison: [DATE]

CLINICAL DATA: Shortness of breath

EXAM:
CHEST - 2 VIEW

[chest pa]
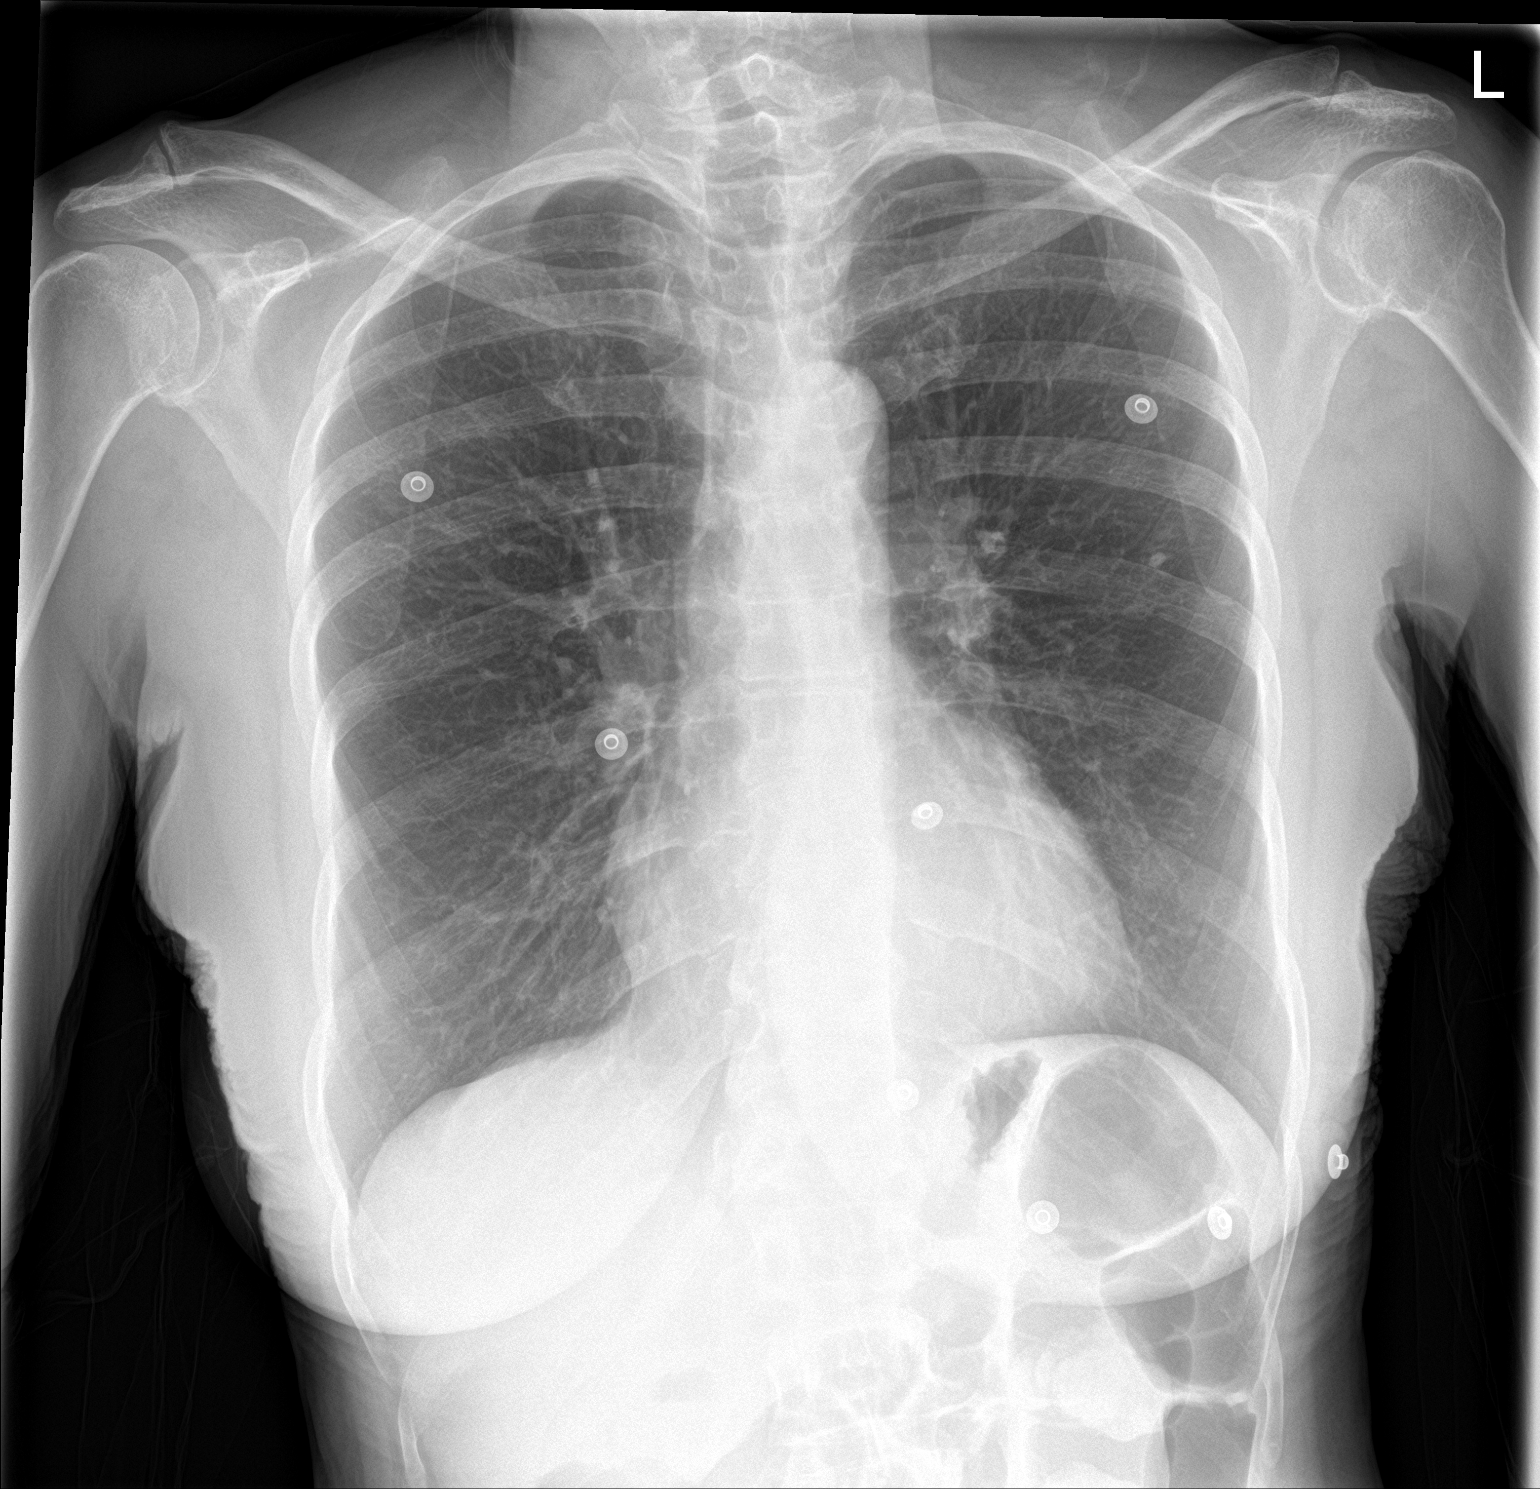

[chest lat]
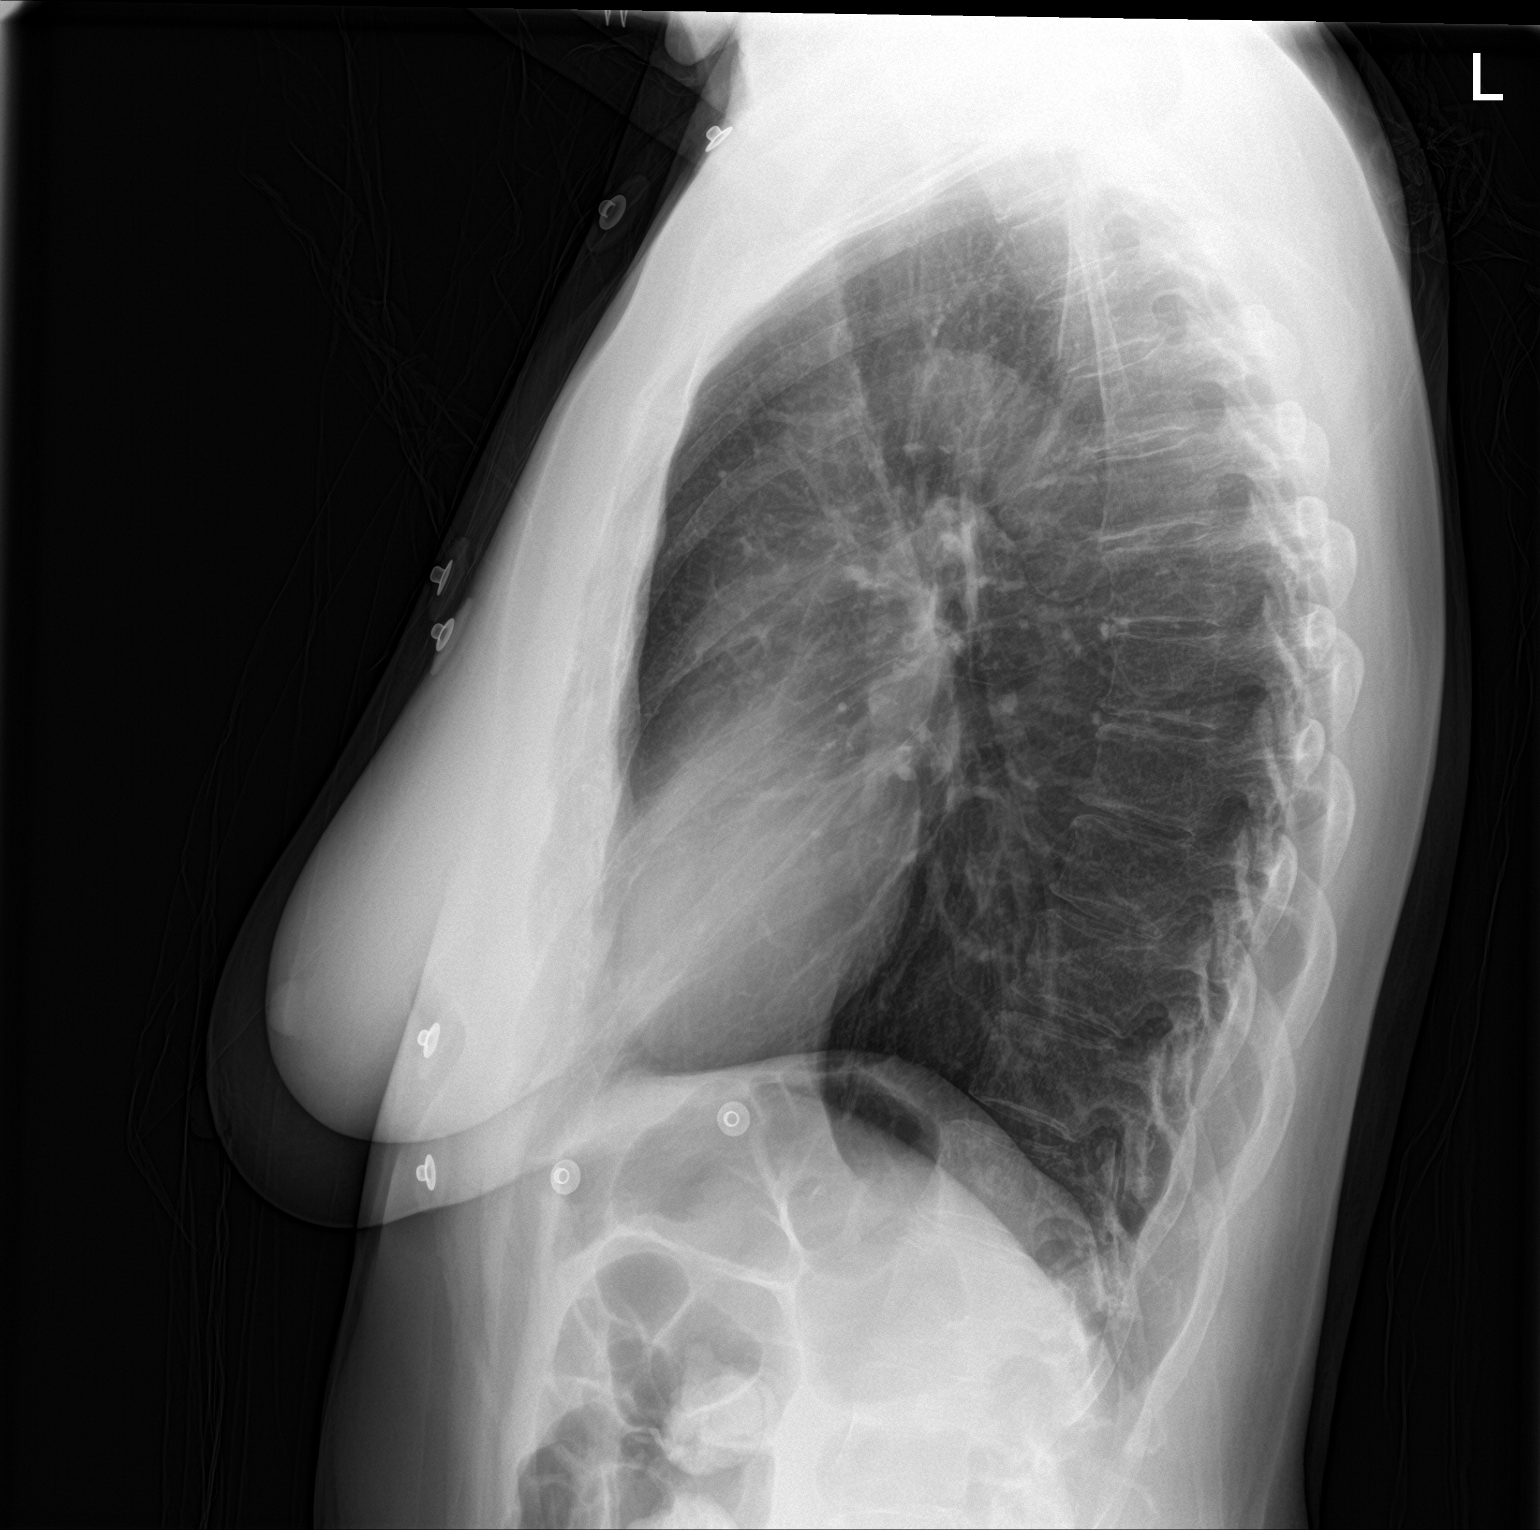

[2 of 2 positions shown; findings below may reference images not displayed]

FINDINGS: The heart size and mediastinal contours are within normal limits.
Both lungs are clear. No acute osseous abnormality.
IMPRESSION: No active cardiopulmonary disease.

## 2021-01-12 MED ORDER — IPRATROPIUM-ALBUTEROL 0.5-2.5 (3) MG/3ML IN SOLN
3.0000 mL | Freq: Once | RESPIRATORY_TRACT | Status: AC
  Administered 2021-01-12: 3 mL via RESPIRATORY_TRACT
  Filled 2021-01-12: qty 3

## 2021-01-12 MED ORDER — PREDNISONE 10 MG PO TABS: 40.0000 mg | ORAL_TABLET | Freq: Every day | ORAL | 0 refills | Status: AC

## 2021-01-12 MED ORDER — METHYLPREDNISOLONE SODIUM SUCC 125 MG IJ SOLR
125.0000 mg | Freq: Once | INTRAMUSCULAR | Status: AC
  Administered 2021-01-12: 125 mg via INTRAVENOUS
  Filled 2021-01-12: qty 2

## 2021-01-12 MED ORDER — POTASSIUM CHLORIDE CRYS ER 20 MEQ PO TBCR
40.0000 meq | EXTENDED_RELEASE_TABLET | Freq: Once | ORAL | Status: AC
  Administered 2021-01-12: 40 meq via ORAL
  Filled 2021-01-12: qty 2

## 2021-01-12 MED ORDER — IPRATROPIUM-ALBUTEROL 0.5-2.5 (3) MG/3ML IN SOLN
3.0000 mL | Freq: Once | RESPIRATORY_TRACT | Status: AC
  Administered 2021-01-13: 3 mL via RESPIRATORY_TRACT
  Filled 2021-01-12: qty 3

## 2021-01-12 NOTE — ED Provider Notes (Signed)
Emergency Medicine Provider Triage Evaluation Note  Heather Austin , a 75 y.o. female  was evaluated in triage.  Pt complains of shortness of breath for two days.  She hasn't run out of any meds.  She reports that her inhalers aren't helping.  No leg swelling.   Non smoker. No known covid contacts.    Review of Systems  Positive: Short of breath Negative: Syncope, chest pain  Physical Exam  BP 135/90 (BP Location: Left Arm)   Pulse 93   Temp 98.4 F (36.9 C) (Oral)   Resp 20   SpO2 97%  Gen:   Awake, no distress   Resp:  Mild tachypnea, decreased air flow bilaterally. MSK:   Moves extremities without difficulty  Other:  No cyanosis.   Medical Decision Making  Medically screening exam initiated at 7:29 PM.  Appropriate orders placed.  Heather Austin was informed that the remainder of the evaluation will be completed by another provider, this initial triage assessment does not replace that evaluation, and the importance of remaining in the ED until their evaluation is complete.   1933: I had patient take 4 puffs of her albuterol.  Attempted to educate on timing as she was activating the pump at the end of her inhale.   Note: Portions of this report may have been transcribed using voice recognition software. Every effort was made to ensure accuracy; however, inadvertent computerized transcription errors may be present    Heather Austin 01/12/21 1934    Heather Belling, MD 01/12/21 [phone]

## 2021-01-12 NOTE — Discharge Instructions (Signed)
I am prescribing you a strong steroid medication called prednisone.  Please only take this as prescribed.  Please take it early in the morning, as this medication can be stimulating and make it difficult to sleep at night.  Please continue to monitor your symptoms closely.  If you develop any new or worsening symptoms please come back to the emergency department immediately.  It was a pleasure to meet you.

## 2021-01-12 NOTE — ED Provider Notes (Signed)
[place] EMERGENCY DEPARTMENT Provider Note   CSN: [id number] Arrival date & time: 01/12/21  1659     History Chief Complaint  Patient presents with   Shortness of Breath    Heather Austin is a 75 y.o. female.  HPI Patient is a 75 year old female with history of hypertension, hyperlipidemia, rheumatoid arthritis, asthma, who presents to the emergency department due to shortness of breath and wheezing.  Her daughter is at bedside and provides additional history.  Patient states about 3 to 4 days ago she began experiencing moderate wheezing and shortness of breath which has continued to worsen.  She states that she ran out of her Stiolto prior to the onset of her symptoms.  Her daughter states that she has currently working with the pharmacy to get this refilled.  She has been continuing to use her inhalers and nebulizers with minimal relief.  Denies any chest pain, chest tightness, rhinorrhea, sore throat.  She does report a mild cough.  She does not smoke.  States her current symptoms are consistent with prior asthma exacerbations.    Past Medical History:  Diagnosis Date   Asthma    Since age 30   HTN (hypertension), benign    Hyperlipidemia    Multiple joint pain    Rheumatoid arthritis [facility])     Patient Active Problem List   Diagnosis Date Noted   Asthma exacerbation 09/30/2020   Acute respiratory failure with hypercapnia ([place]) 09/30/2020   Essential hypertension 09/30/2020   CKD (chronic kidney disease), stage III ([place]) 09/30/2020   Hyperglycemia 09/30/2020   Moderate intermittent asthma with acute exacerbation 06/04/2016   Rheumatoid arthritis ([place]) 06/04/2016   DOE (dyspnea on exertion) 03/02/2013   Asthma with COPD ([place]) 03/13/2012    Past Surgical History:  Procedure Laterality Date   CATARACT EXTRACTION Bilateral      OB History   No obstetric history on file.     Family History  Problem Relation Age of Onset   Hypertension Mother     Asthma Sister    Allergies Son    Allergies Daughter     Social History   Tobacco Use   Smoking status: Never   Smokeless tobacco: Never  Vaping Use   Vaping Use: Never used  Substance Use Topics   Alcohol use: No   Drug use: No    Home Medications Prior to Admission medications   Medication Sig Start Date End Date Taking? Authorizing Provider  albuterol (ACCUNEB) 0.63 MG/3ML nebulizer solution Take 3 mLs by nebulization every 6 (six) hours as needed for shortness of breath or wheezing. 12/18/20  Yes [provider]  albuterol (PROAIR HFA) 108 (90 Base) MCG/ACT inhaler INHALE 2 PUFFS BY MOUTH EVERY 6 HOURS IF NEEDED Patient taking differently: Inhale 2 puffs into the lungs every 6 (six) hours as needed for wheezing or shortness of breath. INHALE 2 PUFFS BY MOUTH EVERY 6 HOURS IF NEEDED 09/20/18  Yes Young, Tarri Fuller D, MD  amLODIPine-Valsartan-HCTZ [phone] MG TABS Take 1 tablet by mouth daily. 12/22/20  Yes [provider]  atorvastatin (LIPITOR) 80 MG tablet Take 80 mg by mouth daily. 03/09/18  Yes [provider]  BIOTIN PO Take 1 capsule by mouth every evening.   Yes [provider]  cholecalciferol (VITAMIN D) 25 MCG (1000 UNIT) tablet Take 1,000 Units by mouth every evening.   Yes [provider]  glipiZIDE (GLUCOTROL XL) 5 MG 24 hr tablet Take 5 mg by mouth daily. 08/08/20  Yes [provider]  memantine (NAMENDA) 10 MG tablet Take 10 mg by mouth 2 (two) times daily. 07/21/20  Yes [provider]  Omega-3 Fatty Acids (FISH OIL) 1000 MG CPDR Take 1,000 mg by mouth every evening.   Yes [provider]  predniSONE (DELTASONE) 10 MG tablet Take 4 tablets (40 mg total) by mouth daily with breakfast for 5 days. 01/12/21 01/17/21 Yes Rayna Sexton, PA-C  sertraline (ZOLOFT) 25 MG tablet Take 25 mg by mouth daily. 12/18/20  Yes [provider]  STIOLTO RESPIMAT 2.5-2.5 MCG/ACT AERS Inhale 2 puffs into the lungs  daily. 09/08/20  Yes [provider]  vitamin B-12 (CYANOCOBALAMIN) 1000 MCG tablet Take 1,000 mcg by mouth daily.   Yes [provider]  budesonide-formoterol (SYMBICORT) 160-4.5 MCG/ACT inhaler Inhale 2 puffs then rinse mouth, twice daily Patient not taking: Reported on 01/12/2021 10/02/20   Shawna Clamp, MD    Allergies    Methotrexate derivatives and Latex  Review of Systems   Review of Systems  All other systems reviewed and are negative. Ten systems reviewed and are negative for acute change, except as noted in the HPI.   Physical Exam Updated Vital Signs BP (!) 146/84   Pulse 76   Temp 98.4 F (36.9 C) (Oral)   Resp 16   Ht '5\' 6"'$  (1.676 m)   Wt 61.7 kg   SpO2 95%   BMI 21.95 kg/m   Physical Exam Vitals and nursing note reviewed.  Constitutional:      General: She is not in acute distress.    Appearance: Normal appearance. She is not ill-appearing, toxic-appearing or diaphoretic.     Interventions: She is not intubated. HENT:     Head: Normocephalic and atraumatic.     Right Ear: External ear normal.     Left Ear: External ear normal.     Nose: Nose normal.     Mouth/Throat:     Mouth: Mucous membranes are moist.     Pharynx: Oropharynx is clear. No oropharyngeal exudate or posterior oropharyngeal erythema.  Eyes:     Extraocular Movements: Extraocular movements intact.  Cardiovascular:     Rate and Rhythm: Normal rate and regular rhythm.     Pulses: Normal pulses.     Heart sounds: Normal heart sounds. No murmur heard.   No friction rub. No gallop.  Pulmonary:     Effort: Pulmonary effort is normal. Tachypnea present. No accessory muscle usage or respiratory distress. She is not intubated.     Breath sounds: No stridor. Examination of the right-upper field reveals wheezing. Examination of the left-upper field reveals wheezing. Examination of the right-middle field reveals wheezing. Examination of the left-middle field reveals wheezing.  Examination of the right-lower field reveals wheezing. Examination of the left-lower field reveals wheezing. Wheezing present. No decreased breath sounds, rhonchi or rales.     Comments: Speaking clearly in short sentences.  Mild tachypnea noted.  Inspiratory and expiratory wheezes noted in both the anterior and posterior lung fields bilaterally. Abdominal:     General: Abdomen is flat.     Tenderness: There is no abdominal tenderness.  Musculoskeletal:        General: Normal range of motion.     Cervical back: Normal range of motion and neck supple. No tenderness.  Skin:    General: Skin is warm and dry.  Neurological:     General: No focal deficit present.     Mental Status: She is alert and oriented to  person, place, and time.  Psychiatric:        Mood and Affect: Mood normal.        Behavior: Behavior normal.   ED Results / Procedures / Treatments   Labs (all labs ordered are listed, but only abnormal results are displayed) Labs Reviewed  BASIC METABOLIC PANEL - Abnormal; Notable for the following components:      Result Value   Potassium 3.3 (*)    Glucose, Bld 100 (*)    BUN 26 (*)    Creatinine, Ser 1.29 (*)    GFR, Estimated 43 (*)    All other components within normal limits  RESP PANEL BY RT-PCR (FLU A&B, COVID) ARPGX2  CBC WITH DIFFERENTIAL/PLATELET  BRAIN NATRIURETIC PEPTIDE    EKG None  Radiology DG Chest 2 View  Result Date: 01/12/2021 CLINICAL DATA:  Shortness of breath EXAM: CHEST - 2 VIEW COMPARISON:  11/17/2020 FINDINGS: The heart size and mediastinal contours are within normal limits. Both lungs are clear. No acute osseous abnormality. IMPRESSION: No active cardiopulmonary disease. Electronically Signed   By: Merilyn Baba M.D.   On: 01/12/2021 19:56    Procedures Procedures   Medications Ordered in ED Medications  ipratropium-albuterol (DUONEB) 0.5-2.5 (3) MG/3ML nebulizer solution 3 mL (has no administration in time range)  methylPREDNISolone  sodium succinate (SOLU-MEDROL) 125 mg/2 mL injection 125 mg (125 mg Intravenous Given 01/12/21 2301)  ipratropium-albuterol (DUONEB) 0.5-2.5 (3) MG/3ML nebulizer solution 3 mL (3 mLs Nebulization Given 01/12/21 2304)  ipratropium-albuterol (DUONEB) 0.5-2.5 (3) MG/3ML nebulizer solution 3 mL (3 mLs Nebulization Given 01/12/21 2303)  potassium chloride SA (KLOR-CON) CR tablet 40 mEq (40 mEq Oral Given 01/12/21 2302)    ED Course  I have reviewed the triage vital signs and the nursing notes.  Pertinent labs & imaging results that were available during my care of the patient were reviewed by me and considered in my medical decision making (see chart for details).  Clinical Course as of 01/12/21 2347  Mon Jan 12, 2021  2314 Patient reassessed.  Currently receiving her second DuoNeb.  Still with diffuse expiratory wheezes but they do appear to have improved significantly.  Oxygen saturations at 98% on room air.  Will order a third DuoNeb and reevaluate the patient. [LJ]    Clinical Course User Index [LJ] Rayna Sexton, PA-C   MDM Rules/Calculators/A&P                          Pt is a 75 y.o. female who presents to the emergency department due to wheezing and shortness of breath.  Labs: CBC without abnormalities. BMP with a potassium of 3.3, glucose of 100, BUN of 26, creatinine 1.29, GFR 43. BNP is pending. Respiratory panel is negative.  Imaging: Chest x-ray shows no active cardiopulmonary disease.  I, Rayna Sexton, PA-C, personally reviewed and evaluated these images and lab results as part of my medical decision-making.  Patient with significant inspiratory and expiratory wheezes bilaterally in all lung fields.  Oxygen saturations in the mid to high 90s on room air.  History of asthma and states that her current symptoms feel similar to prior asthma exacerbations.  Denies any chest pain or chest tightness.  Chest x-ray is reassuring.  Respiratory panel is negative.  CBC without  leukocytosis.  Patient afebrile and not tachycardic.  Doubt infectious process.  Patient has had 2 DuoNeb's as well as IV Solu-Medrol.  Wheezing is improved significantly but patient is still  having expiratory wheezes bilaterally.  Is in my shift and patient care is being transferred to [facility].  Patient pending third DuoNeb as well as ambulation with a pulse ox.  Disposition pending patient's response to treatment.  Note: Portions of this report may have been transcribed using voice recognition software. Every effort was made to ensure accuracy; however, inadvertent computerized transcription errors may be present.   Final Clinical Impression(s) / ED Diagnoses Final diagnoses:  Moderate persistent asthma with (acute) exacerbation   Rx / DC Orders ED Discharge Orders          Ordered    predniSONE (DELTASONE) 10 MG tablet  Daily with breakfast        01/12/21 2346             Rayna Sexton, PA-C 01/12/21 2349    Jeanell Sparrow, DO 01/13/21 0011

## 2021-01-12 NOTE — ED Triage Notes (Signed)
Pt c/o increased shortness of breath x 2 days. Pt states it feels like her asthma but with a cough. Pt has used inhalers at home w/o relief. Pt is able to speak a few words at a time.  Has a non-productive cough.

## 2021-01-13 DIAGNOSIS — I10 Essential (primary) hypertension: Secondary | ICD-10-CM

## 2021-01-13 DIAGNOSIS — N1831 Chronic kidney disease, stage 3a: Secondary | ICD-10-CM | POA: Diagnosis not present

## 2021-01-13 DIAGNOSIS — J45901 Unspecified asthma with (acute) exacerbation: Secondary | ICD-10-CM | POA: Diagnosis not present

## 2021-01-13 DIAGNOSIS — J449 Chronic obstructive pulmonary disease, unspecified: Secondary | ICD-10-CM

## 2021-01-13 LAB — BASIC METABOLIC PANEL
Anion gap: 14 (ref 5–15)
BUN: 36 mg/dL — ABNORMAL HIGH (ref 8–23)
CO2: 20 mmol/L — ABNORMAL LOW (ref 22–32)
Calcium: 9.8 mg/dL (ref 8.9–10.3)
Chloride: 104 mmol/L (ref 98–111)
Creatinine, Ser: 1.47 mg/dL — ABNORMAL HIGH (ref 0.44–1.00)
GFR, Estimated: 37 mL/min — ABNORMAL LOW (ref >60)
Glucose, Bld: 254 mg/dL — ABNORMAL HIGH (ref 70–99)
Potassium: 3.5 mmol/L (ref 3.5–5.1)
Sodium: 138 mmol/L (ref 135–145)

## 2021-01-13 LAB — CBC
HCT: 40.2 % (ref 36.0–46.0)
Hemoglobin: 13.3 g/dL (ref 12.0–15.0)
MCH: 31 pg (ref 26.0–34.0)
MCHC: 33.1 g/dL (ref 30.0–36.0)
MCV: 93.7 fL (ref 80.0–100.0)
Platelets: 199 10*3/uL (ref 150–400)
RBC: 4.29 MIL/uL (ref 3.87–5.11)
RDW: 13.1 % (ref 11.5–15.5)
WBC: 4.5 10*3/uL (ref 4.0–10.5)
nRBC: 0 % (ref 0.0–0.2)

## 2021-01-13 LAB — HEMOGLOBIN A1C
Hgb A1c MFr Bld: 5.6 % (ref 4.8–5.6)
Mean Plasma Glucose: 114.02 mg/dL

## 2021-01-13 LAB — CBG MONITORING, ED: Glucose-Capillary: 263 mg/dL — ABNORMAL HIGH (ref 70–99)

## 2021-01-13 MED ORDER — AMLODIPINE BESYLATE 5 MG PO TABS: 10.0000 mg | ORAL_TABLET | Freq: Every day | ORAL | Status: DC

## 2021-01-13 MED ORDER — UMECLIDINIUM BROMIDE 62.5 MCG/INH IN AEPB
1.0000 | INHALATION_SPRAY | Freq: Every day | RESPIRATORY_TRACT | Status: DC
  Filled 2021-01-13: qty 7

## 2021-01-13 MED ORDER — ALBUTEROL SULFATE (2.5 MG/3ML) 0.083% IN NEBU
2.5000 mg | INHALATION_SOLUTION | RESPIRATORY_TRACT | Status: DC | PRN
  Administered 2021-01-13: 2.5 mg via RESPIRATORY_TRACT
  Filled 2021-01-13: qty 3

## 2021-01-13 MED ORDER — SERTRALINE HCL 50 MG PO TABS: 25.0000 mg | ORAL_TABLET | Freq: Every day | ORAL | Status: DC

## 2021-01-13 MED ORDER — BUDESONIDE-FORMOTEROL FUMARATE 160-4.5 MCG/ACT IN AERO: INHALATION_SPRAY | RESPIRATORY_TRACT | 2 refills | Status: AC

## 2021-01-13 MED ORDER — IRBESARTAN 300 MG PO TABS
300.0000 mg | ORAL_TABLET | Freq: Every day | ORAL | Status: DC
  Filled 2021-01-13: qty 1

## 2021-01-13 MED ORDER — AMLODIPINE-VALSARTAN-HCTZ 10-320-25 MG PO TABS: 1.0000 | ORAL_TABLET | Freq: Every day | ORAL | Status: DC

## 2021-01-13 MED ORDER — HYDROCHLOROTHIAZIDE 25 MG PO TABS: 25.0000 mg | ORAL_TABLET | Freq: Every day | ORAL | Status: DC

## 2021-01-13 MED ORDER — ATORVASTATIN CALCIUM 40 MG PO TABS: 80.0000 mg | ORAL_TABLET | Freq: Every day | ORAL | Status: DC

## 2021-01-13 MED ORDER — ONDANSETRON HCL 4 MG PO TABS: 4.0000 mg | ORAL_TABLET | Freq: Four times a day (QID) | ORAL | Status: DC | PRN

## 2021-01-13 MED ORDER — ALBUTEROL SULFATE (2.5 MG/3ML) 0.083% IN NEBU
10.0000 mg | INHALATION_SOLUTION | Freq: Once | RESPIRATORY_TRACT | Status: AC
  Administered 2021-01-13: 10 mg via RESPIRATORY_TRACT

## 2021-01-13 MED ORDER — INSULIN ASPART 100 UNIT/ML IJ SOLN
0.0000 [IU] | Freq: Three times a day (TID) | INTRAMUSCULAR | Status: DC
  Administered 2021-01-13: 8 [IU] via SUBCUTANEOUS

## 2021-01-13 MED ORDER — ENOXAPARIN SODIUM 40 MG/0.4ML IJ SOSY: 40.0000 mg | PREFILLED_SYRINGE | INTRAMUSCULAR | Status: DC

## 2021-01-13 MED ORDER — MAGNESIUM SULFATE 2 GM/50ML IV SOLN
2.0000 g | Freq: Once | INTRAVENOUS | Status: AC
  Administered 2021-01-13: 2 g via INTRAVENOUS
  Filled 2021-01-13: qty 50

## 2021-01-13 MED ORDER — ACETAMINOPHEN 325 MG PO TABS: 650.0000 mg | ORAL_TABLET | Freq: Four times a day (QID) | ORAL | Status: DC | PRN

## 2021-01-13 MED ORDER — ALBUTEROL (5 MG/ML) CONTINUOUS INHALATION SOLN
10.0000 mg/h | INHALATION_SOLUTION | Freq: Once | RESPIRATORY_TRACT | Status: DC
  Filled 2021-01-13: qty 4

## 2021-01-13 MED ORDER — ONDANSETRON HCL 4 MG/2ML IJ SOLN: 4.0000 mg | Freq: Four times a day (QID) | INTRAMUSCULAR | Status: DC | PRN

## 2021-01-13 MED ORDER — MEMANTINE HCL 10 MG PO TABS
10.0000 mg | ORAL_TABLET | Freq: Two times a day (BID) | ORAL | Status: DC
  Filled 2021-01-13: qty 1

## 2021-01-13 MED ORDER — PREDNISONE 20 MG PO TABS
40.0000 mg | ORAL_TABLET | Freq: Every day | ORAL | Status: DC
  Administered 2021-01-13: 40 mg via ORAL
  Filled 2021-01-13: qty 2

## 2021-01-13 MED ORDER — MOMETASONE FURO-FORMOTEROL FUM 200-5 MCG/ACT IN AERO
2.0000 | INHALATION_SPRAY | Freq: Two times a day (BID) | RESPIRATORY_TRACT | Status: DC
  Filled 2021-01-13: qty 8.8

## 2021-01-13 MED ORDER — ACETAMINOPHEN 650 MG RE SUPP: 650.0000 mg | Freq: Four times a day (QID) | RECTAL | Status: DC | PRN

## 2021-01-13 NOTE — ED Notes (Signed)
Bed placement notified pt will no longer be needing a bed

## 2021-01-13 NOTE — ED Provider Notes (Signed)
23:50: Assumed care of patient from PA Joldersma @ change of shift pending duoneb & re-assessment.   Please see prior provider note for full H&P.  Briefly patient is a 75 year old female with a history of asthma who presented to the emergency department with complaints of shortness of breath and wheezing which has been occurring for about 3 to 4 days but worsened significantly today.  Chest x-ray without infiltrate or fluid overload.  Labs with mild hypokalemia-oral replacement ordered.  Following third DuoNeb patient remains with expiratory wheezing throughout all lung fields.  Her SPO2 dips down to 90% on room air at rest, has had some readings in the upper 80s.  Will give magnesium, start continuous neb, and discussed with hospitalist service for admission- Patient & her daughter are agreeable  Discussed findings and plan of care with attending Dr. Leonette Monarch who is in agreement.  01:57: CONSULT: Discussed with hospitalist Dr. Alcario Drought- accepts admission.    .Critical Care Performed by: Amaryllis Dyke, PA-C Authorized by: Amaryllis Dyke, PA-C  CRITICAL CARE Performed by: Kennith Maes   Total critical care time: 35 minutes  Critical care time was exclusive of separately billable procedures and treating other patients.  Critical care was necessary to treat or prevent imminent or life-threatening deterioration.  Critical care was time spent personally by me on the following activities: development of treatment plan with patient and/or surrogate as well as nursing, discussions with consultants, evaluation of patient's response to treatment, examination of patient, obtaining history from patient or surrogate, ordering and performing treatments and interventions, ordering and review of laboratory studies, ordering and review of radiographic studies, pulse oximetry and re-evaluation of patient's condition.         Leafy Kindle 01/14/21 0534     Fatima Blank, MD 01/16/21 2211

## 2021-01-13 NOTE — H&P (Signed)
History and Physical    Heather Austin WIO:[id number] DOB: 1946-04-10 DOA: 01/12/2021  PCP: Jola Baptist, PA-C  Patient coming from: Home  I have personally briefly reviewed patient's old medical records in [place]  Chief Complaint: SOB  HPI: Heather Austin is a 75 y.o. female with medical history significant of HTN, RA, asthma since age 1.  Pt presents to ED with asthma exacerbation: SOB, wheezing.  Onset about 3-4 days ago.  Ran out of Stiolto prior to onset of symptoms (currently working on getting refill at pharmacy).  Symptoms severe, worsening, not helped by rescue inhaler.  Pt doesn't smoke.  No fevers, chills, CP.  Has mild cough.   ED Course: Pt given steroids, duonebs, with improvement of symptoms significantly.  Still some B expiratory wheezes.   Review of Systems: As per HPI, otherwise all review of systems negative.  Past Medical History:  Diagnosis Date   Asthma    Since age 41   HTN (hypertension), benign    Hyperlipidemia    Multiple joint pain    Rheumatoid arthritis ([place])     Past Surgical History:  Procedure Laterality Date   CATARACT EXTRACTION Bilateral      reports that she has never smoked. She has never used smokeless tobacco. She reports that she does not drink alcohol and does not use drugs.  Allergies  Allergen Reactions   Methotrexate Derivatives     Elevated liver enzymes   Latex Diarrhea and Rash         Family History  Problem Relation Age of Onset   Hypertension Mother    Asthma Sister    Allergies Son    Allergies Daughter      Prior to Admission medications   Medication Sig Start Date End Date Taking? Authorizing Provider  albuterol (ACCUNEB) 0.63 MG/3ML nebulizer solution Take 3 mLs by nebulization every 6 (six) hours as needed for shortness of breath or wheezing. 12/18/20  Yes [provider]  albuterol (PROAIR HFA) 108 (90 Base) MCG/ACT inhaler INHALE 2 PUFFS BY MOUTH EVERY 6 HOURS IF  NEEDED Patient taking differently: Inhale 2 puffs into the lungs every 6 (six) hours as needed for wheezing or shortness of breath. INHALE 2 PUFFS BY MOUTH EVERY 6 HOURS IF NEEDED 09/20/18  Yes Young, Tarri Fuller D, MD  amLODIPine-Valsartan-HCTZ [phone] MG TABS Take 1 tablet by mouth daily. 12/22/20  Yes [provider]  atorvastatin (LIPITOR) 80 MG tablet Take 80 mg by mouth daily. 03/09/18  Yes [provider]  BIOTIN PO Take 1 capsule by mouth every evening.   Yes [provider]  cholecalciferol (VITAMIN D) 25 MCG (1000 UNIT) tablet Take 1,000 Units by mouth every evening.   Yes [provider]  glipiZIDE (GLUCOTROL XL) 5 MG 24 hr tablet Take 5 mg by mouth daily. 08/08/20  Yes [provider]  memantine (NAMENDA) 10 MG tablet Take 10 mg by mouth 2 (two) times daily. 07/21/20  Yes [provider]  Omega-3 Fatty Acids (FISH OIL) 1000 MG CPDR Take 1,000 mg by mouth every evening.   Yes [provider]  predniSONE (DELTASONE) 10 MG tablet Take 4 tablets (40 mg total) by mouth daily with breakfast for 5 days. 01/12/21 01/17/21 Yes Rayna Sexton, PA-C  sertraline (ZOLOFT) 25 MG tablet Take 25 mg by mouth daily. 12/18/20  Yes [provider]  STIOLTO RESPIMAT 2.5-2.5 MCG/ACT AERS Inhale 2 puffs into the lungs daily. 09/08/20  Yes [provider]  vitamin  B-12 (CYANOCOBALAMIN) 1000 MCG tablet Take 1,000 mcg by mouth daily.   Yes [provider]  budesonide-formoterol (SYMBICORT) 160-4.5 MCG/ACT inhaler Inhale 2 puffs then rinse mouth, twice daily Patient not taking: Reported on 01/12/2021 10/02/20   Shawna Clamp, MD    Physical Exam: Vitals:   01/13/21 0254 01/13/21 0300 01/13/21 0315 01/13/21 0330  BP:  137/82 131/73 133/73  Pulse:  86 79 88  Resp:      Temp:      TempSrc:      SpO2: 95% 99% 100% 100%  Weight:      Height:        Constitutional: NAD, calm, comfortable Eyes: PERRL, lids and conjunctivae  normal ENMT: Mucous membranes are moist. Posterior pharynx clear of any exudate or lesions.Normal dentition.  Neck: normal, supple, no masses, no thyromegaly Respiratory: Minimal expiratory wheezes. Cardiovascular: Regular rate and rhythm, no murmurs / rubs / gallops. No extremity edema. 2+ pedal pulses. No carotid bruits.  Abdomen: no tenderness, no masses palpated. No hepatosplenomegaly. Bowel sounds positive.  Musculoskeletal: no clubbing / cyanosis. No joint deformity upper and lower extremities. Good ROM, no contractures. Normal muscle tone.  Skin: no rashes, lesions, ulcers. No induration Neurologic: CN 2-12 grossly intact. Sensation intact, DTR normal. Strength 5/5 in all 4.  Psychiatric: Normal judgment and insight. Alert and oriented x 3. Normal mood.    Labs on Admission: I have personally reviewed following labs and imaging studies  CBC: Recent Labs  Lab 01/12/21 1943 01/13/21 0442  WBC 5.7 4.5  NEUTROABS 3.7  --   HGB 14.3 13.3  HCT 43.5 40.2  MCV 93.8 93.7  PLT 206 440   Basic Metabolic Panel: Recent Labs  Lab 01/12/21 1943  NA 140  K 3.3*  CL 105  CO2 26  GLUCOSE 100*  BUN 26*  CREATININE 1.29*  CALCIUM 9.8   GFR: Estimated Creatinine Clearance: 34.2 mL/min (A) (by C-G formula based on SCr of 1.29 mg/dL (H)). Liver Function Tests: No results for input(s): AST, ALT, ALKPHOS, BILITOT, PROT, ALBUMIN in the last 168 hours. No results for input(s): LIPASE, AMYLASE in the last 168 hours. No results for input(s): AMMONIA in the last 168 hours. Coagulation Profile: No results for input(s): INR, PROTIME in the last 168 hours. Cardiac Enzymes: No results for input(s): CKTOTAL, CKMB, CKMBINDEX, TROPONINI in the last 168 hours. BNP (last 3 results) No results for input(s): PROBNP in the last 8760 hours. HbA1C: No results for input(s): HGBA1C in the last 72 hours. CBG: No results for input(s): GLUCAP in the last 168 hours. Lipid Profile: No results for  input(s): CHOL, HDL, LDLCALC, TRIG, CHOLHDL, LDLDIRECT in the last 72 hours. Thyroid Function Tests: No results for input(s): TSH, T4TOTAL, FREET4, T3FREE, THYROIDAB in the last 72 hours. Anemia Panel: No results for input(s): VITAMINB12, FOLATE, FERRITIN, TIBC, IRON, RETICCTPCT in the last 72 hours. Urine analysis:    Component Value Date/Time   COLORURINE YELLOW 01/04/2019 [place] 01/04/2019 1314   LABSPEC 1.025 01/04/2019 1314   PHURINE 5.5 01/04/2019 1314   GLUCOSEU NEGATIVE 01/04/2019 1314   HGBUR NEGATIVE 01/04/2019 1314   BILIRUBINUR NEGATIVE 01/04/2019 1314   KETONESUR NEGATIVE 01/04/2019 1314   PROTEINUR NEGATIVE 01/04/2019 1314   NITRITE NEGATIVE 01/04/2019 1314   LEUKOCYTESUR NEGATIVE 01/04/2019 1314    Radiological Exams on Admission: DG Chest 2 View  Result Date: 01/12/2021 CLINICAL DATA:  Shortness of breath EXAM: CHEST - 2 VIEW COMPARISON:  11/17/2020 FINDINGS: The heart  size and mediastinal contours are within normal limits. Both lungs are clear. No acute osseous abnormality. IMPRESSION: No active cardiopulmonary disease. Electronically Signed   By: Merilyn Baba M.D.   On: 01/12/2021 19:56    EKG: Independently reviewed.  Assessment/Plan Principal Problem:   Asthma exacerbation Active Problems:   Asthma with COPD ([place])   Essential hypertension   CKD (chronic kidney disease), stage III (HCC)    Asthma exacerbation - COPD pathway Scheduled LABA/LAMA and INH steroid PRN SABA Prednisone Significantly improved already Need to make sure she has preventative inhaler (stilto) at time of DC home HTN - Cont home BP meds CKD 3 - Chronic, stable, baseline DM2 - Mod scale SSI AC while here Hold glipizide  DVT prophylaxis: Lovenox Code Status: Full Family Communication: No family in room Disposition Plan: Home after breathing improved, possibly as early as today given rapid improvement Consults called: None Admission status: Place in  51    Mariadelcarmen Corella, [place] Hospitalists  How to contact the [facility] Attending or Consulting provider [place] or covering provider during after hours [place], for this patient?  Check the care team in [facility] and look for a) attending/consulting TR[facility] provider listed and b) the [facility] team listed Log into [email or web address]  Amion Physician Scheduling and messaging for groups and whole hospitals  On call and physician scheduling software for group practices, residents, hospitalists and other medical providers for call, clinic, rotation and shift schedules. OnCall [employer] is a hospital-wide system for scheduling doctors and paging doctors on call. EasyPlot is for scientific plotting and data analysis.  [email or web address]  and use [place] universal password to access. If you do not have the password, please contact the hospital operator.  Locate the [facility] provider you are looking for under [facility] and page to a number that you can be directly reached. If you still have difficulty reaching the provider, please page the [facility] (Director on Call) for the Hospitalists listed on amion for assistance.  01/13/2021, 5:02 AM

## 2021-12-08 ENCOUNTER — Ambulatory Visit (HOSPITAL_COMMUNITY)
Admission: EM | Admit: 2021-12-08 | Discharge: 2021-12-08 | Disposition: A | Discharge status: Home / Self Care | Payer: Medicare Other | Attending: Emergency Medicine | Admitting: Emergency Medicine

## 2021-12-08 ENCOUNTER — Encounter (HOSPITAL_COMMUNITY): Payer: Self-pay

## 2021-12-08 DIAGNOSIS — R5383 Other fatigue: Secondary | ICD-10-CM

## 2021-12-08 LAB — CBG MONITORING, ED: Glucose-Capillary: 109 mg/dL — ABNORMAL HIGH (ref 70–99)

## 2021-12-08 NOTE — Discharge Instructions (Addendum)
BS is 109 in Urgent Care today. She may be having fatigue issues related to receiving the immunizations recently. She may also be having issues related to here dementia/alzheimers regarding gait. Go to ER for further evaluation of concerns for fatigue and gait issues (labs, IV fluids, CT scan,monitoring).

## 2021-12-08 NOTE — ED Provider Notes (Signed)
[place]    CSN: [id number] Arrival date & time: 12/08/21  1825      History   Chief Complaint Chief Complaint  Patient presents with   Hypersensitivity Reaction    HPI Heather Austin is a 76 y.o. female.   Pt had Covid booster and pneumonia vaccine yesterday, concern for reaction to vaccine per daughter. Pt is alert and oriented x 4. MAEW x4,follows commands. Pt ate breakfast and had cheese danish and frozen drink on way to office per daughter report. Daughter is concerned pt is stumbling and gait is off balance"unsteady". Daughter reports this is not normal for her mom. Pt able to ambulate in room and to exam table and get up and down from exam step. Pt denies any complaints. Denies chest pain,sob,palpations, or headache.  PMH: Diabetes,HTN, dementia  The history is provided by the patient and a relative. No language interpreter was used.    Past Medical History:  Diagnosis Date   Asthma    Since age 57   HTN (hypertension), benign    Hyperlipidemia    Multiple joint pain    Rheumatoid arthritis [facility])     Patient Active Problem List   Diagnosis Date Noted   Fatigue 12/08/2021   Asthma exacerbation 09/30/2020   Acute respiratory failure with hypercapnia ([place]) 09/30/2020   Essential hypertension 09/30/2020   CKD (chronic kidney disease), stage III ([place]) 09/30/2020   Hyperglycemia 09/30/2020   Moderate intermittent asthma with acute exacerbation 06/04/2016   Rheumatoid arthritis ([place]) 06/04/2016   DOE (dyspnea on exertion) 03/02/2013   Asthma with COPD ([place]) 03/13/2012    Past Surgical History:  Procedure Laterality Date   CATARACT EXTRACTION Bilateral     OB History   No obstetric history on file.      Home Medications    Prior to Admission medications   Medication Sig Start Date End Date Taking? Authorizing Provider  albuterol (ACCUNEB) 0.63 MG/3ML nebulizer solution Take 3 mLs by nebulization every 6 (six) hours as needed for shortness of  breath or wheezing. 12/18/20   [provider]  albuterol (PROAIR HFA) 108 (90 Base) MCG/ACT inhaler INHALE 2 PUFFS BY MOUTH EVERY 6 HOURS IF NEEDED Patient taking differently: Inhale 2 puffs into the lungs every 6 (six) hours as needed for wheezing or shortness of breath. INHALE 2 PUFFS BY MOUTH EVERY 6 HOURS IF NEEDED 09/20/18   Baird Lyons D, MD  amLODIPine-Valsartan-HCTZ [phone] MG TABS Take 1 tablet by mouth daily. 12/22/20   [provider]  atorvastatin (LIPITOR) 80 MG tablet Take 80 mg by mouth daily. 03/09/18   [provider]  BIOTIN PO Take 1 capsule by mouth every evening.    [provider]  budesonide-formoterol [facility]) 160-4.5 MCG/ACT inhaler Inhale 2 puffs then rinse mouth, twice daily 01/13/21   Domenic Polite, MD  cholecalciferol (VITAMIN D) 25 MCG (1000 UNIT) tablet Take 1,000 Units by mouth every evening.    [provider]  glipiZIDE (GLUCOTROL XL) 5 MG 24 hr tablet Take 5 mg by mouth daily. 08/08/20   [provider]  memantine (NAMENDA) 10 MG tablet Take 10 mg by mouth 2 (two) times daily. 07/21/20   [provider]  Omega-3 Fatty Acids (FISH OIL) 1000 MG CPDR Take 1,000 mg by mouth every evening.    [provider]  sertraline (ZOLOFT) 25 MG tablet Take 25 mg by mouth daily. 12/18/20   [provider]  vitamin B-12 (CYANOCOBALAMIN) 1000 MCG tablet Take 1,000  mcg by mouth daily.    [provider]    Family History Family History  Problem Relation Age of Onset   Hypertension Mother    Asthma Sister    Allergies Son    Allergies Daughter     Social History Social History   Tobacco Use   Smoking status: Never   Smokeless tobacco: Never  Vaping Use   Vaping Use: Never used  Substance Use Topics   Alcohol use: No   Drug use: No     Allergies   Methotrexate derivatives and Latex   Review of Systems Review of Systems  Constitutional:  Positive for fatigue.   Neurological:  Negative for dizziness, facial asymmetry, speech difficulty, weakness and headaches.  All other systems reviewed and are negative.    Physical Exam Triage Vital Signs ED Triage Vitals [12/08/21 1854]  Enc Vitals Group     BP (!) 149/80     Pulse Rate 72     Resp 16     Temp 98.3 F (36.8 C)     Temp Source Oral     SpO2 94 %     Weight      Height      Head Circumference      Peak Flow      Pain Score 0     Pain Loc      Pain Edu?      Excl. in [place]?    No data found.  Updated Vital Signs BP (!) 149/80 (BP Location: Right Arm)   Pulse 72   Temp 98.3 F (36.8 C) (Oral)   Resp 16   SpO2 94%   Visual Acuity Right Eye Distance:   Left Eye Distance:   Bilateral Distance:    Right Eye Near:   Left Eye Near:    Bilateral Near:     Physical Exam Vitals and nursing note reviewed.  Constitutional:      General: She is not in acute distress.    Appearance: She is well-developed and well-groomed.  HENT:     Head: Normocephalic and atraumatic.     Right Ear: Tympanic membrane normal.     Left Ear: Tympanic membrane normal.     Nose: Nose normal.     Mouth/Throat:     Lips: Pink.     Mouth: Mucous membranes are moist.     Pharynx: Oropharynx is clear.  Eyes:     General: Lids are normal. Vision grossly intact.     Extraocular Movements: Extraocular movements intact.     Conjunctiva/sclera: Conjunctivae normal.     Pupils: Pupils are equal, round, and reactive to light.  Neck:     Trachea: Trachea normal.  Cardiovascular:     Rate and Rhythm: Normal rate and regular rhythm.     Pulses: Normal pulses.     Heart sounds: Normal heart sounds. No murmur heard. Pulmonary:     Effort: Pulmonary effort is normal. No respiratory distress.     Breath sounds: Normal breath sounds and air entry.  Abdominal:     Palpations: Abdomen is soft.     Tenderness: There is no abdominal tenderness.  Musculoskeletal:        General: No swelling.     Cervical  back: Normal range of motion and neck supple.  Skin:    General: Skin is warm and dry.     Capillary Refill: Capillary refill takes less than 2 seconds.  Neurological:     General:  No focal deficit present.     Mental Status: She is alert and oriented to person, place, and time.     GCS: GCS eye subscore is 4. GCS verbal subscore is 5. GCS motor subscore is 6.     Cranial Nerves: Cranial nerves 2-12 are intact.     Sensory: Sensation is intact.     Motor: Motor function is intact. No pronator drift.     Coordination: Coordination is intact.     Gait: Gait is intact.  Psychiatric:        Attention and Perception: Attention normal.        Mood and Affect: Mood normal.        Speech: Speech normal.        Behavior: Behavior normal. Behavior is cooperative.      UC Treatments / Results  Labs (all labs ordered are listed, but only abnormal results are displayed) Labs Reviewed  CBG MONITORING, ED - Abnormal; Notable for the following components:      Result Value   Glucose-Capillary 109 (*)    All other components within normal limits    EKG   Radiology No results found.  Procedures Procedures (including critical care time)  Medications Ordered in UC Medications - No data to display  Initial Impression / Assessment and Plan / UC Course  I have reviewed the triage vital signs and the nursing notes.  Pertinent labs & imaging results that were available during my care of the patient were reviewed by me and considered in my medical decision making (see chart for details).  Discussed with daughter and patient that if you feel that there is a change with pt behavior or gait as it is unclear that there is a change as pt has GCS 15, MAEWx4, no drift, PERRL then would need to go to ER immediately for further evaluation. Patient and daughter both verbalized understanding to this provider.      Ddx: Fatigue, alzheimer's, dehydration, electrolyte imbalance Final Clinical  Impressions(s) / UC Diagnoses   Final diagnoses:  Fatigue, unspecified type     Discharge Instructions      BS is 109 in Urgent Care today. She may be having fatigue issues related to receiving the immunizations recently. She may also be having issues related to here dementia/alzheimers regarding gait. Go to ER for further evaluation of concerns for fatigue and gait issues (labs, IV fluids, CT scan,monitoring).     ED Prescriptions   None    PDMP not reviewed this encounter.   Tori Milks, NP 65/78/46 1940

## 2021-12-08 NOTE — ED Triage Notes (Signed)
Per daughter patient had a pneumonia and covid vaccine yesterday and today she is unsteady on her feet and fatigued.

## 2022-09-12 ENCOUNTER — Emergency Department (HOSPITAL_BASED_OUTPATIENT_CLINIC_OR_DEPARTMENT_OTHER)
Admission: EM | Admit: 2022-09-12 | Discharge: 2022-09-12 | Disposition: A | Discharge status: Home / Self Care | Payer: Medicare Other | Attending: Emergency Medicine | Admitting: Emergency Medicine

## 2022-09-12 ENCOUNTER — Other Ambulatory Visit: Payer: Self-pay

## 2022-09-12 ENCOUNTER — Encounter (HOSPITAL_BASED_OUTPATIENT_CLINIC_OR_DEPARTMENT_OTHER): Payer: Self-pay

## 2022-09-12 DIAGNOSIS — S61309A Unspecified open wound of unspecified finger with damage to nail, initial encounter: Secondary | ICD-10-CM

## 2022-09-12 DIAGNOSIS — Z91040 Latex allergy status: Secondary | ICD-10-CM | POA: Insufficient documentation

## 2022-09-12 DIAGNOSIS — S61300A Unspecified open wound of right index finger with damage to nail, initial encounter: Secondary | ICD-10-CM | POA: Diagnosis present

## 2022-09-12 DIAGNOSIS — Z23 Encounter for immunization: Secondary | ICD-10-CM | POA: Insufficient documentation

## 2022-09-12 DIAGNOSIS — W208XXA Other cause of strike by thrown, projected or falling object, initial encounter: Secondary | ICD-10-CM | POA: Diagnosis not present

## 2022-09-12 MED ORDER — TETANUS-DIPHTH-ACELL PERTUSSIS 5-2.5-18.5 LF-MCG/0.5 IM SUSY
0.5000 mL | PREFILLED_SYRINGE | Freq: Once | INTRAMUSCULAR | Status: AC
  Administered 2022-09-12: 0.5 mL via INTRAMUSCULAR
  Filled 2022-09-12: qty 0.5

## 2022-09-12 NOTE — Discharge Instructions (Signed)
Soak area twice a day for the next 2 days.  Watch for any sign of infection

## 2022-09-12 NOTE — ED Provider Notes (Signed)
[place] EMERGENCY DEPARTMENT AT [facility] Provider Note   CSN: [id number] Arrival date & time: 09/12/22  1647     History  Chief Complaint  Patient presents with   Finger Injury    Heather Austin is a 77 y.o. female.  Patient reports that she tore off the corner of her nail.  Patient reports that the nail is still hanging in there and she would like to have it cut off.  Patient reports that she is not having any pain below in her finger.  Patient does not think that she has injured the bone.  The history is provided by the patient. No language interpreter was used.       Home Medications Prior to Admission medications   Medication Sig Start Date End Date Taking? Authorizing Provider  albuterol (ACCUNEB) 0.63 MG/3ML nebulizer solution Take 3 mLs by nebulization every 6 (six) hours as needed for shortness of breath or wheezing. 12/18/20   [provider]  albuterol (PROAIR HFA) 108 (90 Base) MCG/ACT inhaler INHALE 2 PUFFS BY MOUTH EVERY 6 HOURS IF NEEDED Patient taking differently: Inhale 2 puffs into the lungs every 6 (six) hours as needed for wheezing or shortness of breath. INHALE 2 PUFFS BY MOUTH EVERY 6 HOURS IF NEEDED 09/20/18   Jetty Duhamel D, MD  amLODIPine-Valsartan-HCTZ [phone] MG TABS Take 1 tablet by mouth daily. 12/22/20   [provider]  atorvastatin (LIPITOR) 80 MG tablet Take 80 mg by mouth daily. 03/09/18   [provider]  BIOTIN PO Take 1 capsule by mouth every evening.    [provider]  budesonide-formoterol [facility]) 160-4.5 MCG/ACT inhaler Inhale 2 puffs then rinse mouth, twice daily 01/13/21   Zannie Cove, MD  cholecalciferol (VITAMIN D) 25 MCG (1000 UNIT) tablet Take 1,000 Units by mouth every evening.    [provider]  glipiZIDE (GLUCOTROL XL) 5 MG 24 hr tablet Take 5 mg by mouth daily. 08/08/20   [provider]  memantine (NAMENDA) 10 MG tablet Take 10 mg by mouth 2 (two) times  daily. 07/21/20   [provider]  Omega-3 Fatty Acids (FISH OIL) 1000 MG CPDR Take 1,000 mg by mouth every evening.    [provider]  sertraline (ZOLOFT) 25 MG tablet Take 25 mg by mouth daily. 12/18/20   [provider]  vitamin B-12 (CYANOCOBALAMIN) 1000 MCG tablet Take 1,000 mcg by mouth daily.    [provider]      Allergies    Methotrexate derivatives and Latex    Review of Systems   Review of Systems  All other systems reviewed and are negative.   Physical Exam Updated Vital Signs BP 130/76 (BP Location: Left Arm)   Pulse 63   Temp 97.7 F (36.5 C)   Resp 16   Ht 5\' 6"  (1.676 m)   Wt 65.8 kg   SpO2 99%   BMI 23.40 kg/m  Physical Exam Vitals reviewed.  Constitutional:      Appearance: Normal appearance.  Musculoskeletal:        General: Normal range of motion.     Comments: Broken corner of fingernail on right second finger.  Skin:    General: Skin is warm.  Neurological:     General: No focal deficit present.     Mental Status: She is alert.  Psychiatric:        Mood and Affect: Mood normal.     ED Results / Procedures / Treatments   Labs (  all labs ordered are listed, but only abnormal results are displayed) Labs Reviewed - No data to display  EKG None  Radiology No results found.  Procedures Procedures    Medications Ordered in ED Medications  Tdap (BOOSTRIX) injection 0.5 mL (0.5 mLs Intramuscular Given 09/12/22 1807)    ED Course/ Medical Decision Making/ A&P                             Medical Decision Making Patient complains of a break to the corner of her fingernail.  Amount and/or Complexity of Data Reviewed Independent Historian: caregiver  Risk Prescription drug management. Risk Details: Patient offered an x-ray of her finger she does not think that she has any type of bony injury patient just request that the piece of nail be clipped.   I clipped a small piece of corner of nail that is  torn away.  This does not involve the nailbed.  Patient does not have any bruising she has full range of motion neurovascular neurosensory are intact        Final Clinical Impression(s) / ED Diagnoses Final diagnoses:  Avulsion of fingernail, initial encounter    Rx / DC Orders ED Discharge Orders     None      An After Visit Summary was printed and given to the patient.    Elson Areas, Cordelia Poche 09/12/22 2337    Terald Sleeper, MD 09/13/22 [id number]

## 2022-09-12 NOTE — ED Notes (Signed)
Patient refused XR Imaging at this Time.

## 2022-09-12 NOTE — ED Triage Notes (Signed)
Patient here POV from Home.  Endorses injuring her Right Distal Second Digit when she dropped a Public affairs consultant on it. Unknown Tetanus.   NAD Noted during Triage. A&Ox4. GCS 15. Ambulatory.
